# Patient Record
Sex: Male | Born: 1935 | Race: White | Hispanic: No | Marital: Married | Smoking: Never smoker
Health system: Southern US, Community
[De-identification: ages and names within clinical notes are randomized; demographics above are authoritative.]

## PROBLEM LIST (undated history)

## (undated) DIAGNOSIS — E538 Deficiency of other specified B group vitamins: Secondary | ICD-10-CM

## (undated) DIAGNOSIS — I10 Essential (primary) hypertension: Secondary | ICD-10-CM

## (undated) DIAGNOSIS — I2510 Atherosclerotic heart disease of native coronary artery without angina pectoris: Secondary | ICD-10-CM

## (undated) DIAGNOSIS — G709 Myoneural disorder, unspecified: Secondary | ICD-10-CM

## (undated) DIAGNOSIS — E785 Hyperlipidemia, unspecified: Secondary | ICD-10-CM

## (undated) DIAGNOSIS — I639 Cerebral infarction, unspecified: Secondary | ICD-10-CM

## (undated) DIAGNOSIS — K219 Gastro-esophageal reflux disease without esophagitis: Secondary | ICD-10-CM

## (undated) DIAGNOSIS — C61 Malignant neoplasm of prostate: Secondary | ICD-10-CM

## (undated) DIAGNOSIS — J449 Chronic obstructive pulmonary disease, unspecified: Secondary | ICD-10-CM

## (undated) HISTORY — PX: KNEE SURGERY: SHX244

## (undated) HISTORY — DX: Myoneural disorder, unspecified: G70.9

## (undated) HISTORY — DX: Atherosclerotic heart disease of native coronary artery without angina pectoris: I25.10

## (undated) HISTORY — DX: Essential (primary) hypertension: I10

## (undated) HISTORY — DX: Gastro-esophageal reflux disease without esophagitis: K21.9

## (undated) HISTORY — PX: OTHER SURGICAL HISTORY: SHX169

## (undated) HISTORY — DX: Chronic obstructive pulmonary disease, unspecified: J44.9

## (undated) HISTORY — PX: BACK SURGERY: SHX140

## (undated) HISTORY — DX: Hyperlipidemia, unspecified: E78.5

## (undated) HISTORY — PX: HERNIA REPAIR: SHX51

## (undated) HISTORY — DX: Deficiency of other specified B group vitamins: E53.8

## (undated) HISTORY — DX: Cerebral infarction, unspecified: I63.9

---

## 2004-12-13 ENCOUNTER — Ambulatory Visit: Payer: Self-pay | Admitting: Ophthalmology

## 2004-12-19 ENCOUNTER — Ambulatory Visit: Payer: Self-pay | Admitting: Ophthalmology

## 2005-01-20 ENCOUNTER — Ambulatory Visit: Payer: Self-pay | Admitting: Ophthalmology

## 2005-01-30 ENCOUNTER — Ambulatory Visit: Payer: Self-pay | Admitting: Ophthalmology

## 2005-02-07 ENCOUNTER — Ambulatory Visit: Payer: Self-pay | Admitting: Family Medicine

## 2005-03-24 ENCOUNTER — Emergency Department: Payer: Self-pay | Admitting: General Practice

## 2007-05-02 ENCOUNTER — Ambulatory Visit: Payer: Self-pay | Admitting: Gastroenterology

## 2007-06-03 ENCOUNTER — Ambulatory Visit: Payer: Self-pay | Admitting: Urology

## 2007-07-23 ENCOUNTER — Ambulatory Visit: Payer: Self-pay | Admitting: Urology

## 2007-07-23 ENCOUNTER — Other Ambulatory Visit: Payer: Self-pay

## 2007-08-06 ENCOUNTER — Ambulatory Visit: Payer: Self-pay | Admitting: Urology

## 2007-08-08 ENCOUNTER — Emergency Department: Payer: Self-pay | Admitting: Emergency Medicine

## 2007-10-17 DIAGNOSIS — C61 Malignant neoplasm of prostate: Secondary | ICD-10-CM

## 2007-10-17 HISTORY — DX: Malignant neoplasm of prostate: C61

## 2007-10-30 ENCOUNTER — Ambulatory Visit: Payer: Self-pay | Admitting: Gastroenterology

## 2007-12-05 ENCOUNTER — Other Ambulatory Visit: Payer: Self-pay

## 2007-12-05 ENCOUNTER — Emergency Department: Payer: Self-pay | Admitting: Internal Medicine

## 2007-12-06 LAB — HM COLONOSCOPY: HM COLON: NORMAL

## 2007-12-09 ENCOUNTER — Ambulatory Visit: Payer: Self-pay | Admitting: Gastroenterology

## 2008-04-27 ENCOUNTER — Ambulatory Visit: Payer: Self-pay | Admitting: Specialist

## 2008-05-04 ENCOUNTER — Inpatient Hospital Stay: Payer: Self-pay | Admitting: Specialist

## 2008-08-14 ENCOUNTER — Emergency Department: Payer: Self-pay | Admitting: Emergency Medicine

## 2008-09-16 ENCOUNTER — Ambulatory Visit: Payer: Self-pay | Admitting: Vascular Surgery

## 2008-09-23 ENCOUNTER — Inpatient Hospital Stay: Payer: Self-pay | Admitting: Vascular Surgery

## 2008-10-16 HISTORY — PX: COLONOSCOPY: SHX174

## 2008-12-12 IMAGING — RF DG UGI W/O KUB
1 series · 9 of 9 positions shown · non-contrast
Comparison: none

REASON FOR EXAM: dysphagia
COMMENTS:

PROCEDURE:     FL  - FL UPPER GI  - December 09, 2007  [DATE]
RESULT:     Standard Upper GI was performed.  This was a limited study due
to the patient's difficulty with moving on the table. The esophagus is
widely patent. The stomach and duodenal bulb are normal.  No reflux is
noted.

[Series 1: run · 9 of 9 slices shown]
[im 1/9]
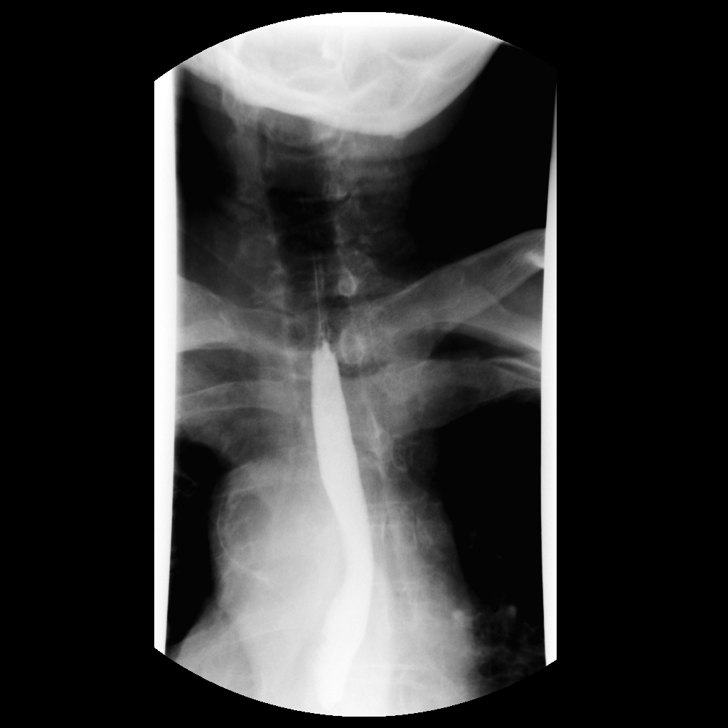
[im 2/9]
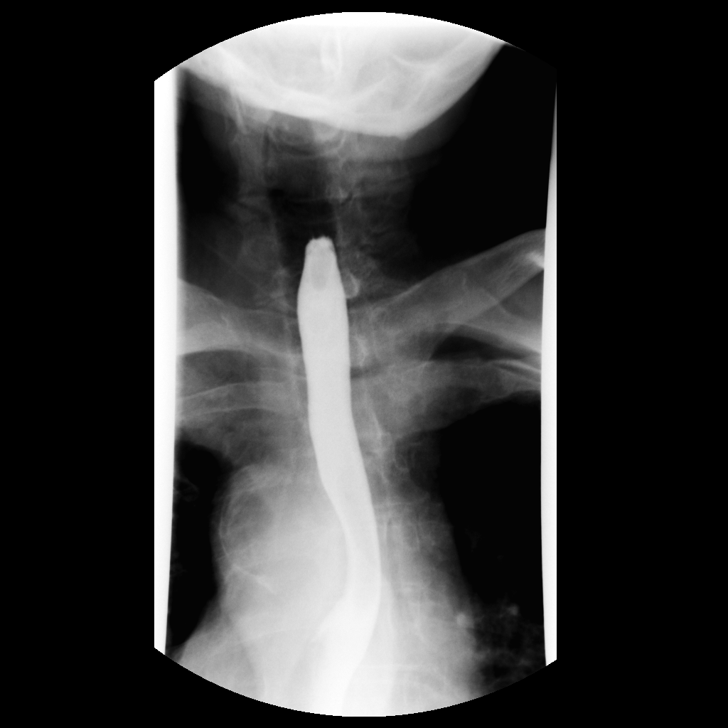
[im 3/9]
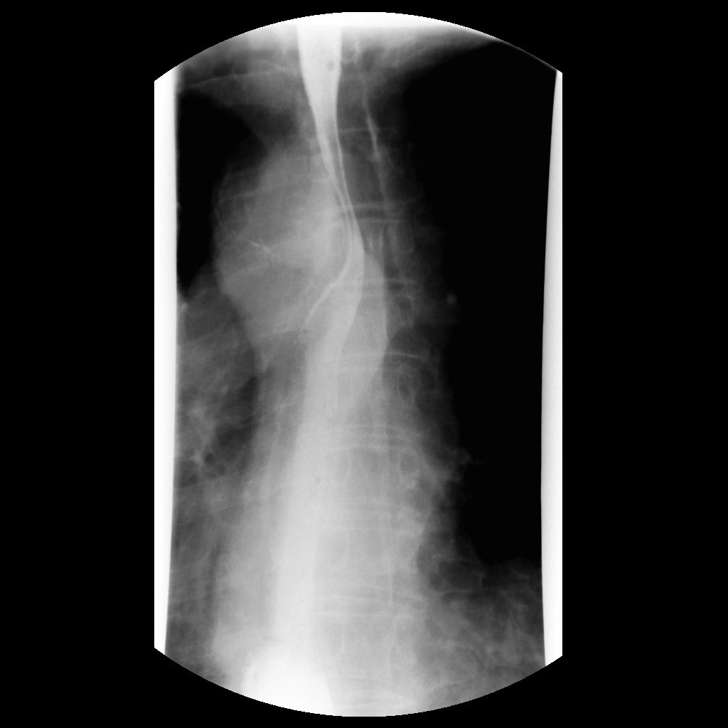
[im 4/9]
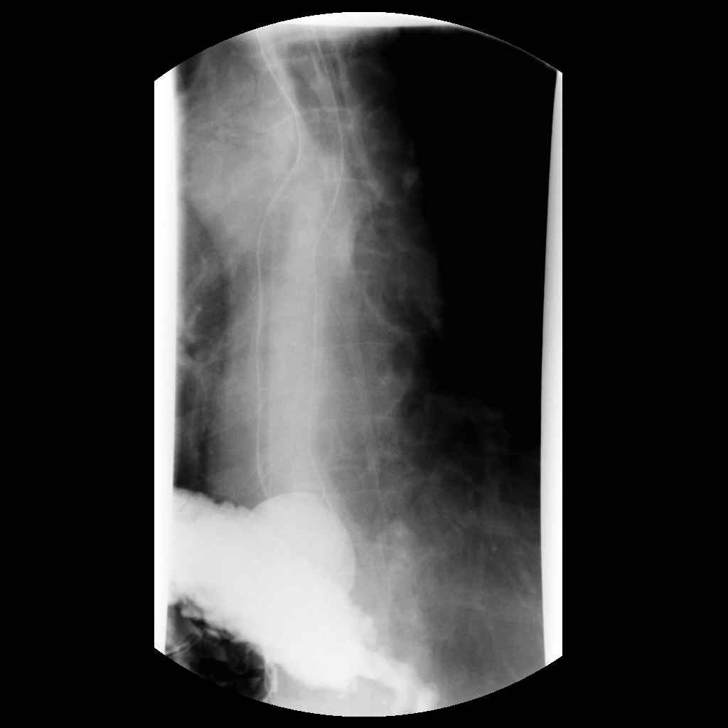
[im 5/9]
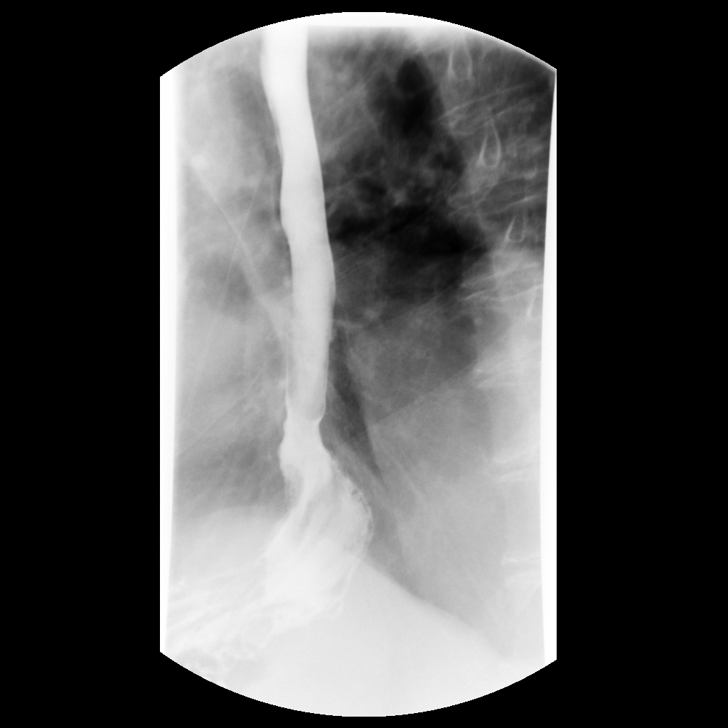
[im 6/9]
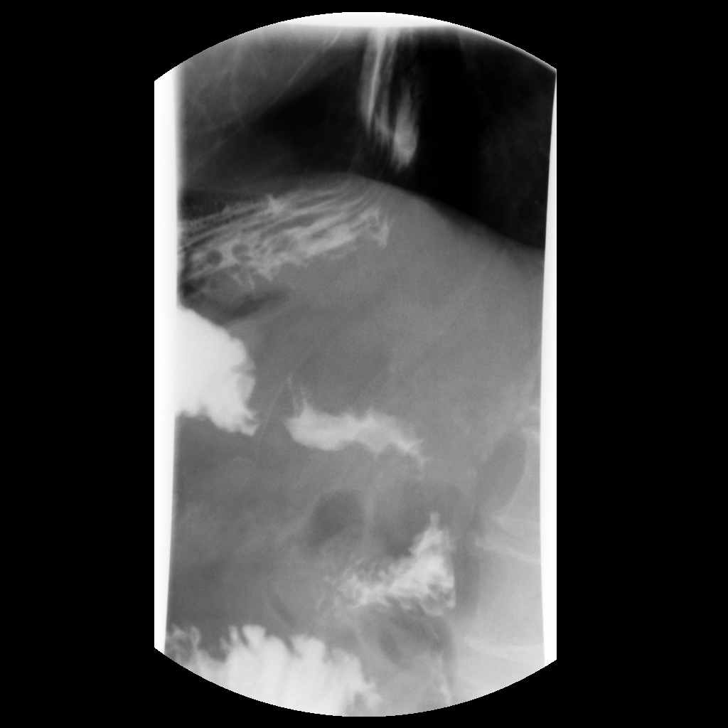
[im 7/9]
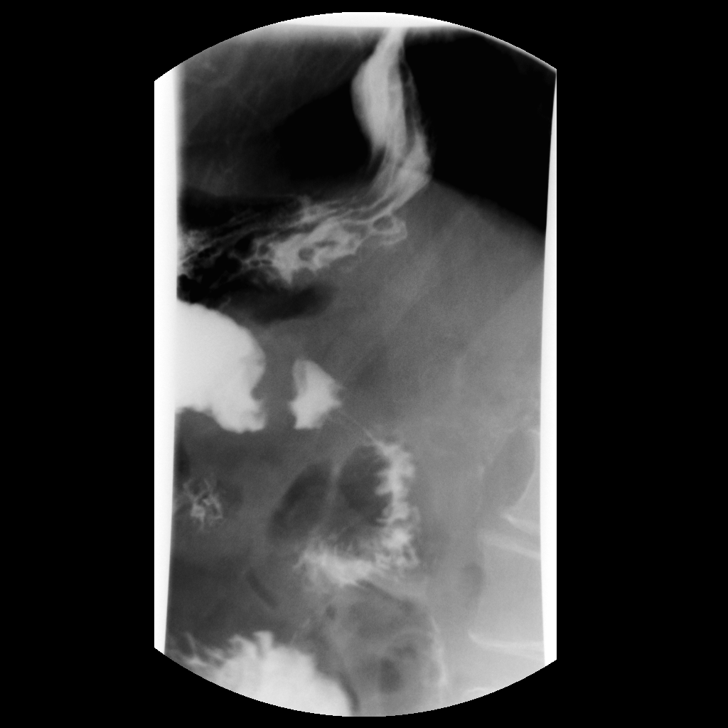
[im 8/9]
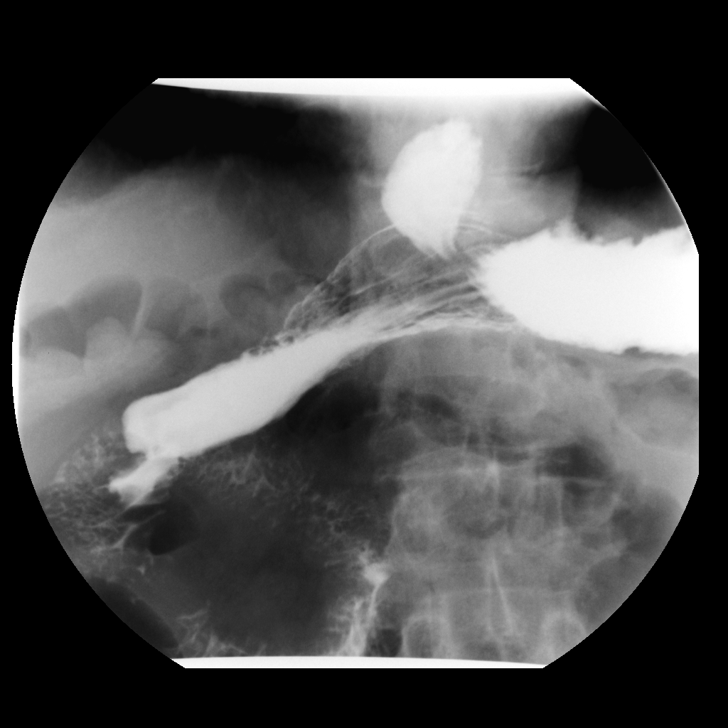
[im 9/9]
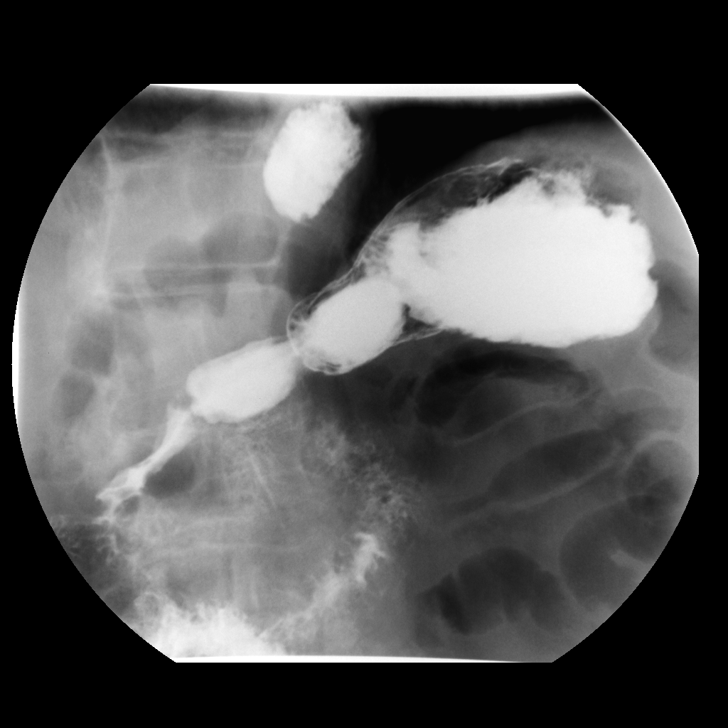

[9 of 9 positions shown; findings below may reference images not displayed]

IMPRESSION: Normal exam.

## 2009-08-11 ENCOUNTER — Ambulatory Visit: Payer: Self-pay | Admitting: Urology

## 2010-05-04 ENCOUNTER — Ambulatory Visit: Payer: Self-pay | Admitting: Vascular Surgery

## 2010-08-17 ENCOUNTER — Emergency Department: Payer: Self-pay | Admitting: Emergency Medicine

## 2012-02-13 ENCOUNTER — Ambulatory Visit: Payer: Self-pay | Admitting: Family Medicine

## 2012-06-25 DIAGNOSIS — C61 Malignant neoplasm of prostate: Secondary | ICD-10-CM | POA: Insufficient documentation

## 2012-06-25 DIAGNOSIS — D419 Neoplasm of uncertain behavior of unspecified urinary organ: Secondary | ICD-10-CM | POA: Insufficient documentation

## 2012-06-25 DIAGNOSIS — M5430 Sciatica, unspecified side: Secondary | ICD-10-CM | POA: Insufficient documentation

## 2012-07-01 DIAGNOSIS — N200 Calculus of kidney: Secondary | ICD-10-CM | POA: Insufficient documentation

## 2012-10-16 ENCOUNTER — Observation Stay: Payer: Self-pay | Admitting: Internal Medicine

## 2012-10-16 LAB — CBC
HCT: 37.6 % — ABNORMAL LOW (ref 40.0–52.0)
HGB: 13.2 g/dL (ref 13.0–18.0)
MCH: 31.7 pg (ref 26.0–34.0)
MCV: 90 fL (ref 80–100)
Platelet: 135 10*3/uL — ABNORMAL LOW (ref 150–440)
RBC: 4.18 10*6/uL — ABNORMAL LOW (ref 4.40–5.90)
RDW: 13.6 % (ref 11.5–14.5)
WBC: 5.7 10*3/uL (ref 3.8–10.6)

## 2012-10-16 LAB — BASIC METABOLIC PANEL
Anion Gap: 9 (ref 7–16)
Calcium, Total: 9.1 mg/dL (ref 8.5–10.1)
Chloride: 106 mmol/L (ref 98–107)
Co2: 24 mmol/L (ref 21–32)
Creatinine: 0.94 mg/dL (ref 0.60–1.30)
EGFR (Non-African Amer.): >60
Glucose: 131 mg/dL — ABNORMAL HIGH (ref 65–99)

## 2012-10-17 LAB — TROPONIN I: Troponin-I: <0.02 ng/mL

## 2012-10-25 ENCOUNTER — Ambulatory Visit: Payer: Self-pay | Admitting: Oncology

## 2012-11-16 ENCOUNTER — Ambulatory Visit: Payer: Self-pay | Admitting: Oncology

## 2013-07-09 DIAGNOSIS — N320 Bladder-neck obstruction: Secondary | ICD-10-CM | POA: Insufficient documentation

## 2013-10-27 ENCOUNTER — Emergency Department: Payer: Self-pay | Admitting: Emergency Medicine

## 2013-10-27 LAB — COMPREHENSIVE METABOLIC PANEL
ANION GAP: 7 (ref 7–16)
Albumin: 3.8 g/dL (ref 3.4–5.0)
Alkaline Phosphatase: 67 U/L
BILIRUBIN TOTAL: 0.4 mg/dL (ref 0.2–1.0)
BUN: 10 mg/dL (ref 7–18)
CHLORIDE: 102 mmol/L (ref 98–107)
CREATININE: 0.86 mg/dL (ref 0.60–1.30)
Calcium, Total: 8.9 mg/dL (ref 8.5–10.1)
Co2: 22 mmol/L (ref 21–32)
EGFR (Non-African Amer.): >60
Glucose: 101 mg/dL — ABNORMAL HIGH (ref 65–99)
Osmolality: 262 (ref 275–301)
Potassium: 3.7 mmol/L (ref 3.5–5.1)
SGOT(AST): 31 U/L (ref 15–37)
SGPT (ALT): 18 U/L (ref 12–78)
Sodium: 131 mmol/L — ABNORMAL LOW (ref 136–145)
TOTAL PROTEIN: 7.1 g/dL (ref 6.4–8.2)

## 2013-10-27 LAB — CBC
HCT: 36.1 % — ABNORMAL LOW (ref 40.0–52.0)
HGB: 12.7 g/dL — ABNORMAL LOW (ref 13.0–18.0)
MCH: 30.7 pg (ref 26.0–34.0)
MCHC: 35.1 g/dL (ref 32.0–36.0)
MCV: 87 fL (ref 80–100)
Platelet: 163 10*3/uL (ref 150–440)
RBC: 4.13 10*6/uL — ABNORMAL LOW (ref 4.40–5.90)
RDW: 13.5 % (ref 11.5–14.5)
WBC: 9 10*3/uL (ref 3.8–10.6)

## 2013-10-27 LAB — CK TOTAL AND CKMB (NOT AT ARMC)
CK, Total: 137 U/L (ref 35–232)
CK-MB: 3.4 ng/mL (ref 0.5–3.6)

## 2013-10-27 LAB — TROPONIN I: Troponin-I: <0.02 ng/mL

## 2013-10-27 LAB — RAPID INFLUENZA A&B ANTIGENS

## 2014-02-17 ENCOUNTER — Ambulatory Visit: Payer: Self-pay | Admitting: Gastroenterology

## 2014-03-21 ENCOUNTER — Inpatient Hospital Stay: Payer: Self-pay | Admitting: Specialist

## 2014-03-21 LAB — COMPREHENSIVE METABOLIC PANEL
ALBUMIN: 3.7 g/dL (ref 3.4–5.0)
ALT: 24 U/L (ref 12–78)
AST: 37 U/L (ref 15–37)
Alkaline Phosphatase: 60 U/L
Anion Gap: 8 (ref 7–16)
BUN: 8 mg/dL (ref 7–18)
Bilirubin,Total: 0.6 mg/dL (ref 0.2–1.0)
Calcium, Total: 8.7 mg/dL (ref 8.5–10.1)
Chloride: 102 mmol/L (ref 98–107)
Co2: 25 mmol/L (ref 21–32)
Creatinine: 0.65 mg/dL (ref 0.60–1.30)
EGFR (African American): >60
EGFR (Non-African Amer.): >60
Glucose: 98 mg/dL (ref 65–99)
Osmolality: 268 (ref 275–301)
Potassium: 4.1 mmol/L (ref 3.5–5.1)
Sodium: 135 mmol/L — ABNORMAL LOW (ref 136–145)
Total Protein: 6.5 g/dL (ref 6.4–8.2)

## 2014-03-21 LAB — CBC
HCT: 35.3 % — ABNORMAL LOW (ref 40.0–52.0)
HGB: 12.1 g/dL — ABNORMAL LOW (ref 13.0–18.0)
MCH: 31.1 pg (ref 26.0–34.0)
MCHC: 34.2 g/dL (ref 32.0–36.0)
MCV: 91 fL (ref 80–100)
Platelet: 124 10*3/uL — ABNORMAL LOW (ref 150–440)
RBC: 3.88 10*6/uL — AB (ref 4.40–5.90)
RDW: 13.8 % (ref 11.5–14.5)
WBC: 3.7 10*3/uL — AB (ref 3.8–10.6)

## 2014-03-21 LAB — PROTIME-INR
INR: 1.1
PROTHROMBIN TIME: 13.9 s (ref 11.5–14.7)

## 2014-03-21 LAB — URINALYSIS, COMPLETE
BILIRUBIN, UR: NEGATIVE
Bacteria: NONE SEEN
Blood: NEGATIVE
GLUCOSE, UR: NEGATIVE mg/dL (ref 0–75)
KETONE: NEGATIVE
Leukocyte Esterase: NEGATIVE
NITRITE: NEGATIVE
PROTEIN: NEGATIVE
Ph: 6 (ref 4.5–8.0)
Specific Gravity: 1.011 (ref 1.003–1.030)
Squamous Epithelial: NONE SEEN

## 2014-03-22 LAB — CBC WITH DIFFERENTIAL/PLATELET
BASOS ABS: 0 10*3/uL (ref 0.0–0.1)
Basophil %: 0.1 %
Eosinophil #: 0 10*3/uL (ref 0.0–0.7)
Eosinophil %: 0 %
HCT: 30.2 % — AB (ref 40.0–52.0)
HGB: 10.3 g/dL — ABNORMAL LOW (ref 13.0–18.0)
LYMPHS ABS: 0.3 10*3/uL — AB (ref 1.0–3.6)
Lymphocyte %: 3.2 %
MCH: 30.8 pg (ref 26.0–34.0)
MCHC: 34.2 g/dL (ref 32.0–36.0)
MCV: 90 fL (ref 80–100)
Monocyte #: 0.6 x10 3/mm (ref 0.2–1.0)
Monocyte %: 5.8 %
Neutrophil #: 9 10*3/uL — ABNORMAL HIGH (ref 1.4–6.5)
Neutrophil %: 90.9 %
Platelet: 120 10*3/uL — ABNORMAL LOW (ref 150–440)
RBC: 3.36 10*6/uL — AB (ref 4.40–5.90)
RDW: 13.9 % (ref 11.5–14.5)
WBC: 9.9 10*3/uL (ref 3.8–10.6)

## 2014-03-22 LAB — BASIC METABOLIC PANEL
ANION GAP: 10 (ref 7–16)
BUN: 12 mg/dL (ref 7–18)
Calcium, Total: 8.7 mg/dL (ref 8.5–10.1)
Chloride: 101 mmol/L (ref 98–107)
Co2: 23 mmol/L (ref 21–32)
Creatinine: 1.07 mg/dL (ref 0.60–1.30)
EGFR (African American): >60
EGFR (Non-African Amer.): >60
Glucose: 134 mg/dL — ABNORMAL HIGH (ref 65–99)
Osmolality: 270 (ref 275–301)
Potassium: 4 mmol/L (ref 3.5–5.1)
Sodium: 134 mmol/L — ABNORMAL LOW (ref 136–145)

## 2014-03-23 LAB — BASIC METABOLIC PANEL
Anion Gap: 6 — ABNORMAL LOW (ref 7–16)
BUN: 13 mg/dL (ref 7–18)
CHLORIDE: 102 mmol/L (ref 98–107)
CO2: 27 mmol/L (ref 21–32)
Calcium, Total: 8.4 mg/dL — ABNORMAL LOW (ref 8.5–10.1)
Creatinine: 0.88 mg/dL (ref 0.60–1.30)
EGFR (African American): >60
EGFR (Non-African Amer.): >60
GLUCOSE: 104 mg/dL — AB (ref 65–99)
Osmolality: 271 (ref 275–301)
Potassium: 3.9 mmol/L (ref 3.5–5.1)
Sodium: 135 mmol/L — ABNORMAL LOW (ref 136–145)

## 2014-03-23 LAB — HEMOGLOBIN: HGB: 9.6 g/dL — ABNORMAL LOW (ref 13.0–18.0)

## 2014-03-25 DIAGNOSIS — E785 Hyperlipidemia, unspecified: Secondary | ICD-10-CM

## 2014-03-25 DIAGNOSIS — S72009D Fracture of unspecified part of neck of unspecified femur, subsequent encounter for closed fracture with routine healing: Secondary | ICD-10-CM

## 2014-03-25 DIAGNOSIS — I10 Essential (primary) hypertension: Secondary | ICD-10-CM

## 2014-03-25 DIAGNOSIS — I2589 Other forms of chronic ischemic heart disease: Secondary | ICD-10-CM

## 2014-03-25 DIAGNOSIS — C61 Malignant neoplasm of prostate: Secondary | ICD-10-CM

## 2014-03-25 DIAGNOSIS — K2100 Gastro-esophageal reflux disease with esophagitis, without bleeding: Secondary | ICD-10-CM

## 2014-03-27 DIAGNOSIS — F3289 Other specified depressive episodes: Secondary | ICD-10-CM

## 2014-03-27 DIAGNOSIS — F411 Generalized anxiety disorder: Secondary | ICD-10-CM

## 2014-03-27 DIAGNOSIS — F329 Major depressive disorder, single episode, unspecified: Secondary | ICD-10-CM

## 2014-03-30 DIAGNOSIS — R059 Cough, unspecified: Secondary | ICD-10-CM

## 2014-03-30 DIAGNOSIS — R05 Cough: Secondary | ICD-10-CM

## 2014-03-30 DIAGNOSIS — K5900 Constipation, unspecified: Secondary | ICD-10-CM

## 2014-03-30 DIAGNOSIS — R0982 Postnasal drip: Secondary | ICD-10-CM

## 2014-05-05 ENCOUNTER — Ambulatory Visit: Payer: Self-pay | Admitting: Gastroenterology

## 2014-10-26 ENCOUNTER — Ambulatory Visit: Payer: Self-pay | Admitting: Family Medicine

## 2014-12-10 LAB — LIPID PANEL
Cholesterol: 167 mg/dL (ref 0–200)
HDL: 42 mg/dL (ref 35–70)
LDL Cholesterol: 99 mg/dL
Triglycerides: 132 mg/dL (ref 40–160)

## 2014-12-10 LAB — PSA: PSA: 2.5

## 2014-12-10 LAB — BASIC METABOLIC PANEL
CREATININE: 0.8 mg/dL (ref <=1.3)
GLUCOSE: 96 mg/dL

## 2015-02-05 NOTE — Discharge Summary (Signed)
DATE OF BIRTH:  1929/08/27  ADMITTING DIAGNOSIS:  Chest pain.   DISCHARGE DIAGNOSES:  1.  Chest pain of unclear etiology at this time, suspected due to work of breathing.  2.  Hypertension.  3.  Hyperlipidemia.  4.  Gastroesophageal reflux disease.  5.  History of stroke, prostate  carcinoma.  6.  Pernicious anemia.  7.  Chronic obstructive pulmonary disease.  8.  Chronic respiratory failure.  9.  Emphysema on CT scan of the chest.  10.  Right upper lobe stellate lesion on CT of chest concerning for possible cancerous condition.   DISCHARGE CONDITION:  Stable.   DISCHARGE MEDICATIONS:  The patient is to continue metoprolol succinate ER 50 mg p.o. daily, aspirin 81 mg p.o. daily, clopidogrel 75 mg p.o. daily, tramadol 50 mg p.o. twice daily, B12 injections, Lupron 1 dose every 4 weeks, alprazolam 0.5 mg p.o. as needed, loratadine 10 mg p.o. daily, pantoprazole 40 mg p.o. daily. The patient is going to taper omeprazole because of concerns of interaction of omeprazole with clopidogrel. This is a new medication.  Home oxygen with portable tank at 2 liters of oxygen through nasal cannula continuously.   DIET:  Two gram salt, low fat, low cholesterol, mechanical soft.   ACTIVITY LIMITATIONS:  As tolerated.     FOLLOWUP:  Appointment with Dr. Otilio Miu in 2 days after discharge, as well as Dr. Oliva Bustard in 2 days after discharge.   CONSULTANTS:  Care management.   RADIOLOGIC STUDIES:  Chest x-ray, portable single view, 10/16/2012, showed hyperinflation, otherwise no acute cardiopulmonary disease. Lower extremity Doppler ultrasound, 10/16/2012, revealed no evidence of DVT in left lower extremity. Myoview stress test on 10/17/2012 revealed normal left ventricular function, normal wall motion, normal sestamibi scintigraphy without evidence of scar or ischemia. CT scan of chest for pulmonary embolism with IV contrast, 10/17/2012, revealed ill-defined 1.3 cm stellate lesion in the upper lobe on the  right on the right, possible lung cancer. There are multiple indeterminate mediastinal, subcarinal, paraesophageal lymph nodes. PET/CT was suggested for further evaluation. Coronary artery disease was also noted.   HISTORY OF PRESENT ILLNESS AND HOSPITAL COURSE:  The patient is an 81 year old Caucasian male with past medical history significant for history of tobacco abuse, who presented to the hospital with complaints of chest pain. Please refer to Dr. Tillman Sers admission note on 10/16/2012.   On arrival to the hospital, the patient's vital signs, temperature was 96.5, pulse was 69, respiratory rate was 20, blood pressure 135/69; saturation was not checked. Physical exam was unremarkable. The patient's EKG showed normal sinus rhythm with low voltage. Ultrasound of the left leg showed no DVT. The patient's lab data revealed normal BMP, except the glucose level was elevated at 131, otherwise unremarkable study. The patient's cardiac enzymes times 3 did not show any abnormalities. The patient's CBC showed a white blood cell count of 5.7, hemoglobin was 13.2, platelet count 135. Chest x-ray was unremarkable.   The patient was admitted to telemetry. His cardiac enzymes were cycled, and he underwent cardiac evaluation with Myoview stress test. Myoview stress test was unremarkable for cardiac ischemia. It showed also normal cardiac function. However, the patient was noted to be hypoxic whenever he was ambulated to be discharged home, and because of concerns of possible emphysema and chronic respiratory failure versus pulmonary embolism, the patient underwent CT scanning of his chest with IV contrast. CT scanning revealed no pulmonary embolism; however, chronic changes of the lungs were described. Changes of COPD were noted. Also  ill-defined 1.3 cm density in the posterior aspect of right upper lobe was noted, concerning for possible lung cancer.   I discussed this patient's case with Dr. Oliva Bustard, who recommended  PET/CT scanning, as well as workup of this nodule in his office in the next few days after discharge. He will be scheduling an appointment with Dr. Oliva Bustard, and the patient will be following with the hematologist, primary care physician, in the next few days after discharge. The patient was also recommended to have oxygen therapy at home. He qualified for oxygen therapy. He is to use 2 liters of oxygen through nasal cannula 24/7. I feel the patient has chronic respiratory failure at this point with underlying COPD, emphysema,  RUL stellate lesion, concerning for lung cancer. He also has  hypertension, hyperlipidemia, gastroesophageal reflux disease, as well as history of stroke, prostate carcinoma. The patient is to continue his outpatient medications. No significant changes were made, except the patient was advised to discontinue his omeprazole, but start on Protonix due to possible interaction with his Plavix. The patient is being discharged home with above-mentioned medications and followup. On the day of discharge patient's blood pressure 152/88, saturation was 93% on 2 liters of oxygen through nasal cannula at rest and 87% at rest on room air.   TIME SPENT:  40 minutes.    ____________________________ Theodoro Grist, MD rv:ms D: 10/17/2012 18:45:00 ET T: 10/17/2012 19:42:34 ET JOB#: [id number]  cc: Delorise Shiner K. Oliva Bustard, MD Juline Patch, MD Theodoro Grist, MD, <Dictator>    [place] MD ELECTRONICALLY SIGNED 11/10/2012 20:59

## 2015-02-05 NOTE — H&P (Signed)
PATIENT NAME:  Alexander Green, Alexander Green MR#:  [id number] DATE OF BIRTH:  [birth year]  DATE OF ADMISSION:  10/16/2012  PRIMARY CARE PHYSICIAN: Juline Patch, MD  CHIEF COMPLAINT: Chest pain.   HISTORY OF PRESENT ILLNESS: This is an 81 year old male who has a history of coronary artery disease. Last night did not sleep well, had chest tightness, worse when he lay down. He had pain in the left side of his chest. It radiated down his left arm. He got short of breath. He also complained of gas and coughing that was worse when he lay down, but today symptoms persisted so he came in to be checked out. Currently he is pain-free, but we will go ahead and admit him for observation for further assessment.   PAST MEDICAL HISTORY:  1.  Coronary artery disease.  2.  Hypertension.  3.  Hyperlipidemia.  4.  History of CVA.  5.  GERD.  6.  Pernicious anemia.  7.  Prostate cancer.   PAST SURGICAL HISTORY: AAA repair in 2009, back surgery, right total knee replacement.   ALLERGIES: No known drug allergies.   CURRENT MEDICATIONS: Alprazolam 0.5 mg q.8 hours p.r.n., aspirin 81 mg daily, Plavix 75 mg daily, loratadine 10 mg daily, metoprolol ER 50 mg daily, omeprazole 40 mg daily, tramadol 50 mg q.6 hours p.r.n., B12 injection monthly, Lupron intramuscular every 6 months.   SOCIAL HISTORY: Stopped smoking 33 years ago. Does not drink alcohol.   FAMILY HISTORY: Significant for cancer, but denies any coronary artery disease in the family.   REVIEW OF SYSTEMS:   CONSTITUTIONAL: No fever or chills.  EYES: No blurred vision.  ENT: No hearing loss.  CARDIOVASCULAR: He has had some chest pain.  PULMONARY: He has had some shortness of breath.  GASTROINTESTINAL: He has had some belching.  GENITOURINARY: No dysuria.  ENDOCRINE: No heat or cold intolerance.  INTEGUMENTARY: No rash.  MUSCULOSKELETAL: Occasional joint pain.  NEUROLOGIC: No numbness or weakness.   PHYSICAL EXAMINATION:  VITAL SIGNS: Temperature is  96.5, pulse 69, respirations 20, blood pressure 135/69.  GENERAL: This is a well-nourished white male in no acute distress.  HEENT: The pupils are equal, round and reactive to light. Sclerae are anicteric. Oral mucosa is moist. Oropharynx is clear. Nasopharynx is clear.  NECK: Supple. No JVD, lymphadenopathy or thyromegaly.  CARDIOVASCULAR: Regular rate and rhythm. There is a I/VI systolic murmur.  LUNGS: Clear to auscultation. No dullness to percussion. He is not using accessory muscles.  ABDOMEN: Soft, nontender, nondistended. Bowel sounds are positive. No hepatosplenomegaly. No masses.  EXTREMITIES: There is edema in the left ankle, none in the other extremity. He moves all extremities and has full range of motion.  NEUROLOGIC: Cranial nerves II through XII are intact. He is alert and oriented x 4.  SKIN: Moist with no rash.   LABORATORY, DIAGNOSTIC AND RADIOLOGICAL DATA: EKG shows normal sinus rhythm with low voltage. Ultrasound of the left leg shows no DVT. White blood cells are 5.7, hemoglobin 13.2. BUN is 9, creatinine 0.94. Troponin is less than 0.02.   ASSESSMENT AND PLAN:  1.  Chest pain, suspicious of coronary artery disease, as he does have a history of this and this pain could be from that. Cannot rule out gastrointestinal at this point. We will go ahead and admit him for observation and cycle cardiac enzymes. If he rules out, I would set him up for Myoview and if this shows no ischemia, then I would pursue gastrointestinal workup as an  outpatient.  2.  Coronary artery disease. Continue his current medications. He is on a beta blocker and aspirin. I did increase his aspirin to full strength because of possible acute event.  3.  Hypertension. He is on a beta blocker for control. Will adjust as necessary.  4.  Gastroesophageal reflux disease. I will continue his proton pump inhibitor.  TIME SPENT ON ADMISSION: 45 minutes.   ____________________________ Baxter Hire,  MD jdj:jm D: 10/16/2012 19:30:06 ET T: 10/16/2012 19:46:10 ET JOB#: [id number]  cc: Baxter Hire, MD, <Dictator> Juline Patch, MD Corey Skains, MD Baxter Hire MD ELECTRONICALLY SIGNED 10/17/2012 20:00

## 2015-02-06 NOTE — Consult Note (Signed)
PATIENT NAME:  Alexander Green, Alexander Green MR#:  [id number] DATE OF BIRTH:  [birth year]  DATE OF CONSULTATION:  03/21/2014  CONSULTING PHYSICIAN:  Laurene Footman, MD  REASON FOR CONSULTATION: Right hip fracture.   HISTORY OF PRESENT ILLNESS: The patient is an 81 year old who fell this morning. He normally uses a walker. He has difficulty with walking secondary to peripheral vascular disease and neuropathy. He had not eaten this morning. He is able to ambulate outside the home with difficulty.   PAST MEDICAL HISTORY: He has significant history of peripheral vascular disease and had a prior aneurysm repair. He has heart disease and is on Plavix.   EXAMINATION:  RIGHT LEG: He is shortened and externally rotated. He does not have a palpable pulse. He has no significant edema. He is able to flex and extend the toes, but diminished sensation to light touch.   RADIOLOGICAL DATA: X-rays reveal an intertrochanteric hip fracture, slightly displaced.   RECOMMENDATION: For open reduction and internal fixation. With his Plavix, it really cannot be reversed, and recommend against waiting. Will plan on surgery today as he has been n.p.o. The risks, benefits and possible complications, in particular bleeding, were discussed. Will use a short rod as this should minimize bleeding intra- and postoperatively.    ___________________________ Laurene Footman, MD mjm:lb D: 03/21/2014 10:18:28 ET T: 03/21/2014 11:26:00 ET JOB#: [id number]  cc: Laurene Footman, MD, <Dictator> Laurene Footman MD ELECTRONICALLY SIGNED 03/21/2014 13:06

## 2015-02-06 NOTE — H&P (Signed)
PATIENT NAME:  Alexander Green, Alexander Green MR#:  [id number] DATE OF BIRTH:  12/22/1928  DATE OF ADMISSION:  03/21/2014  PRIMARY CARE PHYSICIAN: Juline Patch, MD  CHIEF COMPLAINT: A fall and right hip pain.   HISTORY OF PRESENT ILLNESS: This is an 81 year old male who presents to the hospital after a fall he suffered at home. The patient said he was ambulating with the help of his walker, when he saw something on the floor that had fallen down. He attempted to pick it up and then fell backwards and had a difficult time getting up. He was brought to the Emergency Room and noted to have a right hip fracture. The patient denied any prodromal symptoms prior to his fall, like any dizziness, chest pain, shortness of breath, nausea, vomiting or any other associated symptoms. Hospitalist services were contacted for further treatment and evaluation.   REVIEW OF SYSTEMS:  CONSTITUTIONAL: No documented fever. No weight gain, no weight loss.  EYES: No blurry or double vision.  ENT: No tinnitus. No postnasal drip. No redness of the oropharynx.  RESPIRATORY: No cough, no wheeze, no hemoptysis, no dyspnea.  CARDIOVASCULAR: No chest pain, no orthopnea, no palpitations, no syncope.  GASTROINTESTINAL: No nausea, no vomiting. No diarrhea. No abdominal pain. No melena or hematochezia.  GENITOURINARY: No dysuria, no hematuria.  ENDOCRINE: No polyuria or nocturia. No heat or cold intolerance.  HEMATOLOGIC: No anemia, no acute bruising or bleeding.  INTEGUMENT: No rashes, no lesions.  MUSCULOSKELETAL: No arthritis, no swelling, no gout.  NEUROLOGIC: No numbness or tingling. No ataxia. No seizure-type activity.  PSYCHIATRIC: No anxiety. No insomnia. No ADD.   PAST MEDICAL HISTORY: Consistent with:  1. Osteoarthritis.  2. Hypertension.  3. Hyperlipidemia.  4. GERD.  5. History of coronary artery disease.  6. History of prostate cancer.   ALLERGIES: CELEBREX, WHICH CAUSES A RASH.   SOCIAL HISTORY: Used to be a smoker,  quit many years ago. Does have a 30-pack-year smoking history. No alcohol abuse. No illicit drug abuse. Lives at home with his wife.   FAMILY HISTORY: The patient's mother died after a miscarriage. Father died from complications of a cancer of unknown type.   CURRENT MEDICATIONS: As follows:  1. Plavix 75 mg daily.  2. Simvastatin 40 mg daily.  3. Metoprolol succinate 50 mg daily.  4. Protonix 40 mg daily.  5. Nitroglycerin patch 0.1 mg daily.  6. Tramadol 100 mg as needed.  7. Loratadine 10 mg daily.  8. Aspirin 81 mg daily.  9. Vitamin B12 shot every 4 weeks. 10. Lupron shot every 6 months.   PHYSICAL EXAMINATION: Presently, is as follows:  VITAL SIGNS: Noted to be: Temperature is 97.7, pulse 53, respirations 16, blood pressure 139/71, saturation is 95% on room air.  GENERAL: He is a pleasant-appearing male in no apparent distress.  HEAD, EYES, EARS, NOSE AND THROAT: He is atraumatic, normocephalic. Extraocular muscles are intact. Pupils are equal and reactive to light. Sclerae anicteric. No conjunctival injection. No pharyngeal erythema.  NECK: Supple. There is no jugular venous distention. No bruits, no lymphadenopathy, no thyromegaly.  HEART: Regular rate and rhythm, distant, but no murmurs, no rubs, no clicks.  LUNGS: Clear to auscultation bilaterally. No rales or rhonchi. No wheezes.  ABDOMEN: Soft, flat, nontender, nondistended. Has good bowel sounds. No hepatosplenomegaly appreciated.  EXTREMITIES: No evidence of any cyanosis, clubbing or peripheral edema. His right lower extremity is externally rotated and shortened due to the fracture. Has +2 pedal and radial pulses bilaterally.  NEUROLOGICAL: The patient is alert, awake and oriented x3, with no focal motor or sensory deficits appreciated bilaterally.  SKIN: Moist and warm with no rashes appreciated.  LYMPHATIC: There is no cervical or axillary lymphadenopathy.   LABORATORY DATA: Showed a serum glucose of 98, BUN 8, creatinine  0.6, sodium 135, potassium 4.1, chloride 102, bicarbonate 25. The patient's LFTs are within normal limits. White cell count 3.7, hemoglobin 12.1, hematocrit 35.3, platelet count 124. INR is 1.1. Urinalysis within normal limits.   The patient did have an x-ray of the right hip which showed mildly displaced right intertrochanteric fracture. The patient also had a chest x-ray done which showed no evidence of any acute cardiopulmonary disease.   The patient's EKG showed normal sinus rhythm with left access deviation, but no other focal ST or T wave changes appreciated.   ASSESSMENT AND PLAN: This is an 81 year old male with a history of osteoarthritis, hypertension, hyperlipidemia, gastroesophageal reflux disease, history of coronary artery disease, status post stent, prostate cancer, who presents to the hospital after a fall and noted to have a right hip fracture.   1. Right hip fracture. This is secondary to a mechanical fall. The patient is likely a low to moderate risk for noncardiac surgery. There is no contraindication to surgery at this time. EKG reviewed, showed no acute ST changes. I will continue his perioperative beta blocker for now. Hold his aspirin and Plavix for now. Will get an orthopedic consult.  2. Hypertension, hemodynamically stable. Continue with his Toprol.  3. Hyperlipidemia. Continue simvastatin.  4. Gastroesophageal reflux disease. Continue Protonix.  5. History of coronary artery disease. Continue to hold aspirin and Plavix for now given the fact that he may need surgery soon. Continue his beta blocker and statin.  6. History of prostate cancer. This is currently in remission. He follows with Dr. Jacqlyn Larsen. He is on Lupron shots every 6 months.   CODE STATUS: The patient is a full code.   TIME SPENT ON ADMISSION: 45 minutes.    ____________________________ Belia Heman. Verdell Carmine, MD vjs:lb D: 03/21/2014 09:57:21 ET T: 03/21/2014 10:21:44 ET JOB#: [id number]  cc: Belia Heman. Verdell Carmine,  MD, <Dictator> Henreitta Leber MD ELECTRONICALLY SIGNED 03/21/2014 22:00

## 2015-02-06 NOTE — Discharge Summary (Signed)
PATIENT NAME:  Alexander Green, Alexander Green MR#:  [id number] DATE OF BIRTH:  Feb 13, 1929  DATE OF ADMISSION:  03/21/2014 DATE OF DISCHARGE:  03/24/2014  For a detailed note, please take a look at the history and physical done on admission by me.   DIAGNOSES AT DISCHARGE: 1.  Status post fall and right hip fracture.  2.  Hypertension.  3.  Hyperlipidemia.  4.  History of previous coronary artery disease.  5.  Gastroesophageal reflux disease.   DISCHARGE DIET: The patient is being discharged on a low-sodium, low-fat diet.   ACTIVITY: As tolerated.   FOLLOWUP: With Dr. Otilio Miu in the next 1 to 2 weeks. The patient is being discharged to a skilled nursing facility.   DISCHARGE MEDICATIONS:  Loratadine 10 mg daily, simvastatin 40 mg at bedtime, nitroglycerins patch transdermally daily, aspirin 81 mg daily, metoprolol succinate 50 mg daily, Plavix 75 mg daily, Protonix 40 mg daily, vitamin B12 1000 mcg monthly, Lupron shots every 6 months 45 mg intramuscular, Tylenol with hydrocodone 1 tab q.4 to 6 hours as needed for pain.   [place]: Dr. Hessie Knows from orthopedics.   LABORATORY, DIAGNOSTIC AND RADIOLOGICAL DATA DURING The HOSPITAL COURSE:  Are as follows: An x-ray of the right hip showing mildly displaced right intertrochanteric fracture with varus angulation of the femur. A chest x-ray done on admission showing no acute cardiopulmonary disease.   PROCEDURES DONE DURING THE HOSPITAL COURSE: Status post right hip nailing and pinning.   BRIEF HOSPITAL COURSE: This is an 81 year old male with medical problems as mentioned above, presented to the hospital after a fall and noted to have a right hip fracture.  1.  Right hip fracture. This was secondary to a mechanical fall. The patient was cleared from a medical standpoint for surgery and taken to the OR by Dr. Rudene Christians, who performed a pinning of his right hip. Post surgery, the patient has been able to work well with physical therapy.  His pain has been well controlled on some p.r.n. Norco. The patient, after being evaluated by physical therapy, would benefit from short-term rehab which is where he presently is being discharged. He will continue his Plavix and aspirin and follow up with orthopedics as an outpatient.  2.  Hypertension. The patient remained hemodynamically stable. He will continue his Toprol.  3.  Hyperlipidemia. The patient was maintained on his simvastatin. He will resume that.  4.  Gastroesophageal reflux disease. The patient was maintained on his Protonix. He will resume that.  5.  History of coronary artery disease. The patient had no acute chest pain. He will continue his aspirin, Plavix, beta blocker and statin as stated.  6.  History of prostate cancer. This is currently remission. The patient takes Lupron shots every 6 months. He follows up with Dr. Jacqlyn Larsen.  7.  Anemia. This is acute blood loss anemia from surgery. He did not require any transfusions. His hemoglobin remained stable.  8.  CODE STATUS:  The patient is a full code.   DISPOSITION: He is being discharged to a skilled nursing facility for ongoing care.   TIME SPENT: 35 minutes.   ____________________________ Belia Heman. Verdell Carmine, MD vjs:cs D: 03/24/2014 15:15:39 ET T: 03/24/2014 15:33:27 ET JOB#: [id number]  cc: Belia Heman. Verdell Carmine, MD, <Dictator> Juline Patch, MD Henreitta Leber MD ELECTRONICALLY SIGNED 04/14/2014 20:27

## 2015-02-06 NOTE — Consult Note (Signed)
Brief Consult Note: Diagnosis: right intertrochanteric hip fracture.   Patient was seen by consultant.   Consult note dictated.   Recommend to proceed with surgery or procedure.   Orders entered.   Comments: plan [employer] rod later today risks benefits and potential cvmplications discussed.  Electronic Signatures: Laurene Footman (MD)  (Signed 06-Jun-15 10:12)  Authored: Brief Consult Note   Last Updated: 06-Jun-15 10:12 by Laurene Footman (MD)

## 2015-02-06 NOTE — Op Note (Signed)
PATIENT NAME:  Alexander Green, Alexander Green MR#:  [id number] DATE OF BIRTH:  [birth year]  DATE OF PROCEDURE:  03/21/2014  PREOPERATIVE DIAGNOSIS: Right intertrochanteric hip fracture.   POSTOPERATIVE DIAGNOSIS:  Right intertrochanteric hip fracture.  PROCEDURE: ORIF right intertrochanteric hip fracture with cephalomedullary device.   ANESTHESIA: General.   SURGEON: Laurene Footman, MD  DESCRIPTION OF PROCEDURE: The patient was brought to the Operating Room and after adequate anesthesia was obtained, the patient was placed on the fracture table, the left leg in the well leg holder, right foot in the traction boot without traction applied, just slight internal rotation. AP and lateral C-arm views were obtained and showed anatomic alignment. The hip was prepped and draped in the usual sterile fashion and appropriate patient identification and timeout procedures were carried out.  A proximal skin incision was made just proximal to the greater trochanter. Soft tissue spread and the guidewire inserted into the tip of the trochanter. Proximal reaming carried out. An 11-mm short 130-degree nail was then inserted, impacted to the appropriate level and then a small lateral incision was made and a guidewire inserted through the 130-degree guide, measured, drilled, and a 110-mm lag screw was inserted to the subchondral bone. Next, the proximal locking mechanism was tightened and then quarter turned to loosen to allow for compression. Traction was released, and compression was applied through the lateral device. The drill sleeve was removed for the large screw along with the guidewire. A small lateral incision was made for the distal locking screw. Subcutaneous tissue spread, the drill sleeve placed, drilled, measured, and a 36-mm lag screw was inserted through the distal drill hole 5.0 mm in diameter, until it was placed to the appropriate depth. Permanent C-arm views were obtained. After removal of the insertion device, the  hip was thoroughly irrigated and closed with #1 Vicryl proximally, 2-0 Vicryl subcutaneously and skin staples. Xeroform, 4 x 4's, ABDs and tape applied.   ESTIMATED BLOOD LOSS: 250 mL.   COMPLICATIONS: None.   SPECIMEN: None.   IMPLANT: Affixus 130-degree hip fracture nail, 11 x 180 mm, with lag screw and distal interlock.   CONDITION TO RECOVERY ROOM: Stable.      ____________________________ Laurene Footman, MD mjm:lm D: 03/21/2014 16:03:34 ET T: 03/22/2014 01:32:27 ET JOB#: [id number]  cc: Laurene Footman, MD, <Dictator> Laurene Footman MD ELECTRONICALLY SIGNED 03/22/2014 7:30

## 2015-02-10 DIAGNOSIS — I679 Cerebrovascular disease, unspecified: Secondary | ICD-10-CM | POA: Insufficient documentation

## 2015-02-10 DIAGNOSIS — K219 Gastro-esophageal reflux disease without esophagitis: Secondary | ICD-10-CM | POA: Insufficient documentation

## 2015-02-10 DIAGNOSIS — I10 Essential (primary) hypertension: Secondary | ICD-10-CM | POA: Insufficient documentation

## 2015-02-10 DIAGNOSIS — E538 Deficiency of other specified B group vitamins: Secondary | ICD-10-CM | POA: Insufficient documentation

## 2015-02-10 DIAGNOSIS — E7849 Other hyperlipidemia: Secondary | ICD-10-CM | POA: Insufficient documentation

## 2015-02-10 DIAGNOSIS — I2510 Atherosclerotic heart disease of native coronary artery without angina pectoris: Secondary | ICD-10-CM | POA: Insufficient documentation

## 2015-04-13 ENCOUNTER — Other Ambulatory Visit: Payer: Self-pay | Admitting: Family Medicine

## 2015-04-13 DIAGNOSIS — E785 Hyperlipidemia, unspecified: Secondary | ICD-10-CM

## 2015-04-13 DIAGNOSIS — I10 Essential (primary) hypertension: Secondary | ICD-10-CM

## 2015-04-13 DIAGNOSIS — I639 Cerebral infarction, unspecified: Secondary | ICD-10-CM

## 2015-04-13 DIAGNOSIS — K219 Gastro-esophageal reflux disease without esophagitis: Secondary | ICD-10-CM

## 2015-04-16 ENCOUNTER — Ambulatory Visit (INDEPENDENT_AMBULATORY_CARE_PROVIDER_SITE_OTHER): Payer: Medicare Other

## 2015-04-16 DIAGNOSIS — D519 Vitamin B12 deficiency anemia, unspecified: Secondary | ICD-10-CM | POA: Diagnosis not present

## 2015-04-16 MED ORDER — CYANOCOBALAMIN 1000 MCG/ML IJ SOLN: 1000.0000 ug | Freq: Once | INTRAMUSCULAR | Status: DC

## 2015-05-17 ENCOUNTER — Ambulatory Visit (INDEPENDENT_AMBULATORY_CARE_PROVIDER_SITE_OTHER): Payer: Medicare Other

## 2015-05-17 DIAGNOSIS — D519 Vitamin B12 deficiency anemia, unspecified: Secondary | ICD-10-CM

## 2015-05-17 MED ORDER — CYANOCOBALAMIN 1000 MCG/ML IJ SOLN
1000.0000 ug | Freq: Once | INTRAMUSCULAR | Status: AC
  Administered 2015-05-17: 1000 ug via INTRAMUSCULAR

## 2015-06-07 ENCOUNTER — Ambulatory Visit (INDEPENDENT_AMBULATORY_CARE_PROVIDER_SITE_OTHER): Payer: Medicare Other | Admitting: Family Medicine

## 2015-06-07 ENCOUNTER — Encounter: Payer: Self-pay | Admitting: Family Medicine

## 2015-06-07 VITALS — BP 108/76 | HR 68 | Ht 71.0 in | Wt 161.4 lb

## 2015-06-07 DIAGNOSIS — I10 Essential (primary) hypertension: Secondary | ICD-10-CM

## 2015-06-07 DIAGNOSIS — E784 Other hyperlipidemia: Secondary | ICD-10-CM

## 2015-06-07 DIAGNOSIS — Z8639 Personal history of other endocrine, nutritional and metabolic disease: Secondary | ICD-10-CM | POA: Insufficient documentation

## 2015-06-07 DIAGNOSIS — E785 Hyperlipidemia, unspecified: Secondary | ICD-10-CM

## 2015-06-07 DIAGNOSIS — Z8679 Personal history of other diseases of the circulatory system: Secondary | ICD-10-CM | POA: Insufficient documentation

## 2015-06-07 DIAGNOSIS — K219 Gastro-esophageal reflux disease without esophagitis: Secondary | ICD-10-CM | POA: Diagnosis not present

## 2015-06-07 DIAGNOSIS — I471 Supraventricular tachycardia: Secondary | ICD-10-CM | POA: Insufficient documentation

## 2015-06-07 DIAGNOSIS — E7849 Other hyperlipidemia: Secondary | ICD-10-CM

## 2015-06-07 DIAGNOSIS — Z8546 Personal history of malignant neoplasm of prostate: Secondary | ICD-10-CM | POA: Insufficient documentation

## 2015-06-07 DIAGNOSIS — I2510 Atherosclerotic heart disease of native coronary artery without angina pectoris: Secondary | ICD-10-CM | POA: Diagnosis not present

## 2015-06-07 DIAGNOSIS — I639 Cerebral infarction, unspecified: Secondary | ICD-10-CM | POA: Diagnosis not present

## 2015-06-07 MED ORDER — CLOPIDOGREL BISULFATE 75 MG PO TABS: ORAL_TABLET | ORAL | Status: DC

## 2015-06-07 MED ORDER — METOPROLOL SUCCINATE ER 50 MG PO TB24: ORAL_TABLET | ORAL | Status: DC

## 2015-06-07 MED ORDER — PANTOPRAZOLE SODIUM 40 MG PO TBEC: DELAYED_RELEASE_TABLET | ORAL | Status: DC

## 2015-06-07 MED ORDER — NITROGLYCERIN 0.1 MG/HR TD PT24: MEDICATED_PATCH | TRANSDERMAL | Status: DC

## 2015-06-07 MED ORDER — SIMVASTATIN 40 MG PO TABS
ORAL_TABLET | ORAL | Status: DC
Start: 2015-06-07 — End: 2015-12-02

## 2015-06-07 NOTE — Progress Notes (Signed)
Name: Alexander Green   MRN: [id number]    DOB: Oct 02, 1929   Date:06/07/2015       Progress Note  Subjective  Chief Complaint  Chief Complaint  Patient presents with  . Gastrophageal Reflux  . Hypertension  . Hyperlipidemia  . Coronary Artery Disease    Gastrophageal Reflux He reports no abdominal pain, no belching, no chest pain, no choking, no coughing, no dysphagia, no early satiety, no globus sensation, no heartburn, no hoarse voice, no nausea, no sore throat, no stridor, no tooth decay, no water brash or no wheezing. This is a recurrent problem. The current episode started more than 1 year ago. The problem occurs occasionally. The problem has been gradually improving. Nothing aggravates the symptoms. Pertinent negatives include no anemia, fatigue, melena, muscle weakness, orthopnea or weight loss. There are no known risk factors. He has tried a PPI for the symptoms. The treatment provided mild relief.  Hypertension This is a recurrent problem. The current episode started more than 1 year ago. The problem has been gradually improving since onset. The problem is controlled. Pertinent negatives include no anxiety, blurred vision, chest pain, headaches, malaise/fatigue, neck pain, orthopnea, palpitations, peripheral edema, PND, shortness of breath or sweats. There are no associated agents to hypertension. Risk factors for coronary artery disease include dyslipidemia, male gender and post-menopausal state. Past treatments include beta blockers. The current treatment provides mild improvement. There are no compliance problems.  There is no history of angina, kidney disease, CAD/MI, CVA, heart failure, left ventricular hypertrophy, PVD, renovascular disease or retinopathy. There is no history of chronic renal disease.  Hyperlipidemia This is a recurrent problem. The current episode started more than 1 year ago. The problem is controlled. Recent lipid tests were reviewed and are low. He has no  history of chronic renal disease, diabetes, hypothyroidism, liver disease, obesity or nephrotic syndrome. Factors aggravating his hyperlipidemia include beta blockers. Pertinent negatives include no chest pain, focal weakness, myalgias or shortness of breath. Current antihyperlipidemic treatment includes statins. The current treatment provides mild improvement of lipids. There are no compliance problems.  Risk factors for coronary artery disease include hypertension, male sex, post-menopausal, a sedentary lifestyle and dyslipidemia.  Coronary Artery Disease Presents for follow-up visit. The disease course has been improving. Pertinent negatives include no chest pain, chest pressure, chest tightness, dizziness, leg swelling, muscle weakness, palpitations, shortness of breath or weight gain. Risk factors include hyperlipidemia and hypertension. Risk factors do not include obesity. Past treatments include antiplatelet agents, beta blockers, aspirin and nitrates. There is no history of angina pectoris, arrhythmia, cardiomyopathy, CHF, past myocardial infarction, pericarditis or valvular heart disease. Past surgical history does not include angioplasty. The symptoms have been stable. Compliance with diet is good. Compliance with exercise is variable. Compliance with medications is good.    No problem-specific assessment & plan notes found for this encounter.   Past Medical History  Diagnosis Date  . GERD (gastroesophageal reflux disease)   . Hypertension   . Hyperlipidemia   . B12 deficiency   . Stroke   . Neuromuscular disorder   . CAD (coronary artery disease)   . COPD (chronic obstructive pulmonary disease)     Past Surgical History  Procedure Laterality Date  . Back surgery    . Hernia repair    . Hip replacemnt (r)    . Knee surgery Right   . Colonoscopy  2010    History reviewed. No pertinent family history.  Social History   Social History  .  Marital Status: Married    Spouse  Name: N/A  . Number of Children: N/A  . Years of Education: N/A   Occupational History  . Not on file.   Social History Main Topics  . Smoking status: Never Smoker   . Smokeless tobacco: Not on file  . Alcohol Use: No  . Drug Use: No  . Sexual Activity: No   Other Topics Concern  . Not on file   Social History Narrative    Allergies  Allergen Reactions  . Celecoxib      Review of Systems  Constitutional: Negative for fever, chills, weight loss, weight gain, malaise/fatigue and fatigue.  HENT: Negative for ear discharge, ear pain, hoarse voice and sore throat.   Eyes: Negative for blurred vision.  Respiratory: Negative for cough, sputum production, choking, chest tightness, shortness of breath and wheezing.   Cardiovascular: Negative for chest pain, palpitations, orthopnea, leg swelling and PND.  Gastrointestinal: Negative for heartburn, dysphagia, nausea, abdominal pain, diarrhea, constipation, blood in stool and melena.  Genitourinary: Negative for dysuria, urgency, frequency and hematuria.  Musculoskeletal: Negative for myalgias, back pain, joint pain, muscle weakness and neck pain.  Skin: Negative for rash.  Neurological: Negative for dizziness, tingling, sensory change, focal weakness and headaches.  Endo/Heme/Allergies: Negative for environmental allergies and polydipsia. Does not bruise/bleed easily.  Psychiatric/Behavioral: Negative for depression and suicidal ideas. The patient is not nervous/anxious and does not have insomnia.      Objective  Filed Vitals:   06/07/15 0836  BP: 108/76  Pulse: 68  Height: 5\' 11"  (1.803 m)  Weight: 161 lb 6.4 oz (73.211 kg)    Physical Exam  Constitutional: He is oriented to person, place, and time and well-developed, well-nourished, and in no distress.  HENT:  Head: Normocephalic.  Right Ear: External ear normal.  Left Ear: External ear normal.  Nose: Nose normal.  Mouth/Throat: Oropharynx is clear and moist.  Eyes:  Conjunctivae and EOM are normal. Pupils are equal, round, and reactive to light. Right eye exhibits no discharge. Left eye exhibits no discharge. No scleral icterus.  Neck: Normal range of motion. Neck supple. No JVD present. No tracheal deviation present. No thyromegaly present.  Cardiovascular: Normal rate, regular rhythm, normal heart sounds and intact distal pulses.  Exam reveals no gallop and no friction rub.   No murmur heard. Pulmonary/Chest: Breath sounds normal. No respiratory distress. He has no wheezes. He has no rales.  Abdominal: Soft. Bowel sounds are normal. He exhibits no mass. There is no hepatosplenomegaly. There is no tenderness. There is no rebound, no guarding and no CVA tenderness.  Musculoskeletal: Normal range of motion. He exhibits no edema or tenderness.  Lymphadenopathy:    He has no cervical adenopathy.  Neurological: He is alert and oriented to person, place, and time. He has normal sensation, normal strength, normal reflexes and intact cranial nerves. No cranial nerve deficit.  Skin: Skin is warm. No rash noted.  Psychiatric: Mood and affect normal.  Nursing note and vitals reviewed.     Assessment & Plan  Problem List Items Addressed This Visit      Cardiovascular and Mediastinum   Atherosclerosis of coronary artery   Relevant Medications   clopidogrel (PLAVIX) 75 MG tablet   nitroGLYCERIN (NITRODUR - DOSED IN MG/24 HR) 0.1 mg/hr patch   metoprolol succinate (TOPROL-XL) 50 MG 24 hr tablet   simvastatin (ZOCOR) 40 MG tablet   Other Relevant Orders   CBC   Essential (primary) hypertension - Primary  Relevant Medications   nitroGLYCERIN (NITRODUR - DOSED IN MG/24 HR) 0.1 mg/hr patch   metoprolol succinate (TOPROL-XL) 50 MG 24 hr tablet   simvastatin (ZOCOR) 40 MG tablet   Other Relevant Orders   Renal Function Panel     Digestive   Gastro-esophageal reflux disease without esophagitis   Relevant Medications   pantoprazole (PROTONIX) 40 MG tablet      Other   Familial multiple lipoprotein-type hyperlipidemia   Relevant Medications   nitroGLYCERIN (NITRODUR - DOSED IN MG/24 HR) 0.1 mg/hr patch   metoprolol succinate (TOPROL-XL) 50 MG 24 hr tablet   simvastatin (ZOCOR) 40 MG tablet   Other Relevant Orders   Lipid Profile   Hepatic function panel    Other Visit Diagnoses    Cerebrovascular accident (stroke)        Relevant Medications    clopidogrel (PLAVIX) 75 MG tablet    nitroGLYCERIN (NITRODUR - DOSED IN MG/24 HR) 0.1 mg/hr patch    metoprolol succinate (TOPROL-XL) 50 MG 24 hr tablet    simvastatin (ZOCOR) 40 MG tablet    Essential hypertension        Relevant Medications    nitroGLYCERIN (NITRODUR - DOSED IN MG/24 HR) 0.1 mg/hr patch    metoprolol succinate (TOPROL-XL) 50 MG 24 hr tablet    simvastatin (ZOCOR) 40 MG tablet    GERD without esophagitis        Relevant Medications    pantoprazole (PROTONIX) 40 MG tablet    Hyperlipidemia        Relevant Medications    nitroGLYCERIN (NITRODUR - DOSED IN MG/24 HR) 0.1 mg/hr patch    metoprolol succinate (TOPROL-XL) 50 MG 24 hr tablet    simvastatin (ZOCOR) 40 MG tablet         Dr. Randal Yepiz [facility]  06/07/2015

## 2015-06-08 LAB — RENAL FUNCTION PANEL
Albumin: 4.9 g/dL — ABNORMAL HIGH (ref 3.5–4.7)
BUN / CREAT RATIO: 13 (ref 10–22)
BUN: 11 mg/dL (ref 8–27)
CALCIUM: 9.8 mg/dL (ref 8.6–10.2)
CO2: 23 mmol/L (ref 18–29)
CREATININE: 0.84 mg/dL (ref 0.76–1.27)
Chloride: 99 mmol/L (ref 97–108)
GFR calc Af Amer: 93 mL/min/{1.73_m2} (ref >59)
GFR calc non Af Amer: 80 mL/min/{1.73_m2} (ref >59)
GLUCOSE: 88 mg/dL (ref 65–99)
POTASSIUM: 4.2 mmol/L (ref 3.5–5.2)
Phosphorus: 3.5 mg/dL (ref 2.5–4.5)
SODIUM: 138 mmol/L (ref 134–144)

## 2015-06-08 LAB — HEPATIC FUNCTION PANEL
ALT: 10 IU/L (ref 0–44)
AST: 20 IU/L (ref 0–40)
Alkaline Phosphatase: 65 IU/L (ref 39–117)
BILIRUBIN TOTAL: 0.5 mg/dL (ref 0.0–1.2)
BILIRUBIN, DIRECT: 0.21 mg/dL (ref 0.00–0.40)
Total Protein: 6.9 g/dL (ref 6.0–8.5)

## 2015-06-08 LAB — CBC
HEMATOCRIT: 38.1 % (ref 37.5–51.0)
Hemoglobin: 12.4 g/dL — ABNORMAL LOW (ref 12.6–17.7)
MCH: 28.2 pg (ref 26.6–33.0)
MCHC: 32.5 g/dL (ref 31.5–35.7)
MCV: 87 fL (ref 79–97)
Platelets: 145 10*3/uL — ABNORMAL LOW (ref 150–379)
RBC: 4.39 x10E6/uL (ref 4.14–5.80)
RDW: 14.5 % (ref 12.3–15.4)
WBC: 5.7 10*3/uL (ref 3.4–10.8)

## 2015-06-08 LAB — LIPID PANEL
CHOLESTEROL TOTAL: 158 mg/dL (ref 100–199)
Chol/HDL Ratio: 4.3 ratio units (ref 0.0–5.0)
HDL: 37 mg/dL — ABNORMAL LOW (ref >39)
LDL CALC: 95 mg/dL (ref 0–99)
Triglycerides: 130 mg/dL (ref 0–149)
VLDL Cholesterol Cal: 26 mg/dL (ref 5–40)

## 2015-06-17 ENCOUNTER — Ambulatory Visit (INDEPENDENT_AMBULATORY_CARE_PROVIDER_SITE_OTHER): Payer: Medicare Other

## 2015-06-17 DIAGNOSIS — D519 Vitamin B12 deficiency anemia, unspecified: Secondary | ICD-10-CM

## 2015-06-17 MED ORDER — CYANOCOBALAMIN 1000 MCG/ML IJ SOLN
1000.0000 ug | Freq: Once | INTRAMUSCULAR | Status: AC
  Administered 2015-06-17: 1000 ug via INTRAMUSCULAR

## 2015-07-19 ENCOUNTER — Ambulatory Visit (INDEPENDENT_AMBULATORY_CARE_PROVIDER_SITE_OTHER): Payer: Medicare Other

## 2015-07-19 DIAGNOSIS — D519 Vitamin B12 deficiency anemia, unspecified: Secondary | ICD-10-CM | POA: Diagnosis not present

## 2015-07-19 MED ORDER — CYANOCOBALAMIN 1000 MCG/ML IJ SOLN
1000.0000 ug | Freq: Once | INTRAMUSCULAR | Status: AC
  Administered 2015-07-19: 1000 ug via INTRAMUSCULAR

## 2015-08-17 ENCOUNTER — Ambulatory Visit (INDEPENDENT_AMBULATORY_CARE_PROVIDER_SITE_OTHER): Payer: Medicare Other

## 2015-08-17 DIAGNOSIS — D519 Vitamin B12 deficiency anemia, unspecified: Secondary | ICD-10-CM

## 2015-08-17 MED ORDER — CYANOCOBALAMIN 1000 MCG/ML IJ SOLN
1000.0000 ug | Freq: Once | INTRAMUSCULAR | Status: AC
  Administered 2015-08-17: 1000 ug via INTRAMUSCULAR

## 2015-08-18 ENCOUNTER — Other Ambulatory Visit: Payer: Self-pay | Admitting: Urology

## 2015-08-18 DIAGNOSIS — C61 Malignant neoplasm of prostate: Secondary | ICD-10-CM

## 2015-09-06 ENCOUNTER — Encounter
Admission: RE | Admit: 2015-09-06 | Discharge: 2015-09-06 | Disposition: A | Discharge status: Home / Self Care | Payer: Medicare Other | Source: Ambulatory Visit | Attending: Urology | Admitting: Urology

## 2015-09-06 ENCOUNTER — Ambulatory Visit
Admission: RE | Admit: 2015-09-06 | Discharge: 2015-09-06 | Disposition: A | Discharge status: Home / Self Care | Payer: Medicare Other | Source: Ambulatory Visit | Attending: Urology | Admitting: Urology

## 2015-09-06 DIAGNOSIS — C61 Malignant neoplasm of prostate: Secondary | ICD-10-CM | POA: Insufficient documentation

## 2015-09-06 DIAGNOSIS — Z9889 Other specified postprocedural states: Secondary | ICD-10-CM | POA: Insufficient documentation

## 2015-09-06 DIAGNOSIS — K4090 Unilateral inguinal hernia, without obstruction or gangrene, not specified as recurrent: Secondary | ICD-10-CM | POA: Insufficient documentation

## 2015-09-06 DIAGNOSIS — N200 Calculus of kidney: Secondary | ICD-10-CM | POA: Insufficient documentation

## 2015-09-06 DIAGNOSIS — I2510 Atherosclerotic heart disease of native coronary artery without angina pectoris: Secondary | ICD-10-CM | POA: Insufficient documentation

## 2015-09-06 DIAGNOSIS — K8020 Calculus of gallbladder without cholecystitis without obstruction: Secondary | ICD-10-CM | POA: Diagnosis not present

## 2015-09-06 LAB — POCT I-STAT CREATININE: Creatinine, Ser: 0.9 mg/dL (ref 0.61–1.24)

## 2015-09-06 MED ORDER — TECHNETIUM TC 99M MEDRONATE IV KIT
25.0000 | PACK | Freq: Once | INTRAVENOUS | Status: AC | PRN
  Administered 2015-09-06: 23.57 via INTRAVENOUS

## 2015-09-06 MED ORDER — IOHEXOL 350 MG/ML SOLN
80.0000 mL | Freq: Once | INTRAVENOUS | Status: AC | PRN
  Administered 2015-09-06: 80 mL via INTRAVENOUS

## 2015-09-14 ENCOUNTER — Encounter: Payer: Self-pay | Admitting: Family Medicine

## 2015-09-14 ENCOUNTER — Ambulatory Visit (INDEPENDENT_AMBULATORY_CARE_PROVIDER_SITE_OTHER): Payer: Medicare Other | Admitting: Family Medicine

## 2015-09-14 VITALS — BP 130/68 | HR 72 | Temp 98.1°F | Ht 71.0 in | Wt 163.5 lb

## 2015-09-14 DIAGNOSIS — I639 Cerebral infarction, unspecified: Secondary | ICD-10-CM

## 2015-09-14 DIAGNOSIS — J40 Bronchitis, not specified as acute or chronic: Secondary | ICD-10-CM | POA: Diagnosis not present

## 2015-09-14 DIAGNOSIS — J309 Allergic rhinitis, unspecified: Secondary | ICD-10-CM | POA: Diagnosis not present

## 2015-09-14 DIAGNOSIS — E538 Deficiency of other specified B group vitamins: Secondary | ICD-10-CM | POA: Diagnosis not present

## 2015-09-14 MED ORDER — CYANOCOBALAMIN 1000 MCG/ML IJ SOLN
1000.0000 ug | Freq: Once | INTRAMUSCULAR | Status: AC
  Administered 2015-09-14: 1000 ug via INTRAMUSCULAR

## 2015-09-14 MED ORDER — AZELASTINE HCL 0.1 % NA SOLN: 1.0000 | Freq: Every day | NASAL | Status: DC

## 2015-09-14 MED ORDER — CYANOCOBALAMIN 1000 MCG/ML IJ SOLN: 1000.0000 ug | Freq: Once | INTRAMUSCULAR | Status: DC

## 2015-09-14 MED ORDER — LEVOFLOXACIN 500 MG PO TABS: 500.0000 mg | ORAL_TABLET | Freq: Every day | ORAL | Status: DC

## 2015-09-14 NOTE — Progress Notes (Signed)
Name: Alexander Green   MRN: [id number]    DOB: 1929-09-05   Date:09/14/2015       Progress Note  Subjective  Chief Complaint  Chief Complaint  Patient presents with  . Cough    Cough This is a new problem. The current episode started 1 to 4 weeks ago. The problem has been gradually worsening. The problem occurs every few minutes. The cough is productive of sputum. Associated symptoms include nasal congestion and postnasal drip. Pertinent negatives include no chest pain, chills, ear congestion, ear pain, fever, headaches, heartburn, hemoptysis, myalgias, rash, sore throat, shortness of breath, weight loss or wheezing. Nothing aggravates the symptoms. He has tried nothing for the symptoms. The treatment provided mild relief. There is no history of COPD, emphysema or environmental allergies.    No problem-specific assessment & plan notes found for this encounter.   Past Medical History  Diagnosis Date  . GERD (gastroesophageal reflux disease)   . Hypertension   . Hyperlipidemia   . B12 deficiency   . Stroke ([place])   . Neuromuscular disorder ([place])   . CAD (coronary artery disease)   . COPD (chronic obstructive pulmonary disease) ([place])   . Cancer [facility])     Past Surgical History  Procedure Laterality Date  . Back surgery    . Hernia repair    . Hip replacemnt (r)    . Knee surgery Right   . Colonoscopy  2010    No family history on file.  Social History   Social History  . Marital Status: Married    Spouse Name: N/A  . Number of Children: N/A  . Years of Education: N/A   Occupational History  . Not on file.   Social History Main Topics  . Smoking status: Never Smoker   . Smokeless tobacco: Not on file  . Alcohol Use: No  . Drug Use: No  . Sexual Activity: No   Other Topics Concern  . Not on file   Social History Narrative    Allergies  Allergen Reactions  . Celecoxib      Review of Systems  Constitutional: Negative for fever, chills, weight  loss and malaise/fatigue.  HENT: Positive for postnasal drip. Negative for ear discharge, ear pain and sore throat.   Eyes: Negative for blurred vision.  Respiratory: Positive for cough. Negative for hemoptysis, sputum production, shortness of breath and wheezing.   Cardiovascular: Negative for chest pain, palpitations and leg swelling.  Gastrointestinal: Negative for heartburn, nausea, abdominal pain, diarrhea, constipation, blood in stool and melena.  Genitourinary: Negative for dysuria, urgency, frequency and hematuria.  Musculoskeletal: Negative for myalgias, back pain, joint pain and neck pain.  Skin: Negative for rash.  Neurological: Negative for dizziness, tingling, sensory change, focal weakness and headaches.  Endo/Heme/Allergies: Negative for environmental allergies and polydipsia. Does not bruise/bleed easily.  Psychiatric/Behavioral: Negative for depression and suicidal ideas. The patient is not nervous/anxious and does not have insomnia.      Objective  Filed Vitals:   09/14/15 0802  BP: 130/68  Pulse: 72  Temp: 98.1 F (36.7 C)  Height: 5\' 11"  (1.803 m)  Weight: 163 lb 8 oz (74.163 kg)  SpO2: 95%    Physical Exam  Constitutional: He is oriented to person, place, and time and well-developed, well-nourished, and in no distress.  HENT:  Head: Normocephalic.  Right Ear: External ear normal.  Left Ear: External ear normal.  Nose: Nose normal.  Mouth/Throat: Oropharynx is clear and moist.  Eyes: Conjunctivae  and EOM are normal. Pupils are equal, round, and reactive to light. Right eye exhibits no discharge. Left eye exhibits no discharge. No scleral icterus.  Neck: Normal range of motion. Neck supple. No JVD present. No tracheal deviation present. No thyromegaly present.  Cardiovascular: Normal rate, regular rhythm, normal heart sounds and intact distal pulses.  Exam reveals no gallop and no friction rub.   No murmur heard. Pulmonary/Chest: Breath sounds normal. No  respiratory distress. He has no wheezes. He has no rales.  Abdominal: Soft. Bowel sounds are normal. He exhibits no mass. There is no hepatosplenomegaly. There is no tenderness. There is no rebound, no guarding and no CVA tenderness.  Musculoskeletal: Normal range of motion. He exhibits no edema or tenderness.  Lymphadenopathy:    He has no cervical adenopathy.  Neurological: He is alert and oriented to person, place, and time. He has normal sensation, normal strength, normal reflexes and intact cranial nerves. No cranial nerve deficit.  Skin: Skin is warm. No rash noted.  Psychiatric: Mood and affect normal.  Nursing note and vitals reviewed.     Assessment & Plan  Problem List Items Addressed This Visit      Digestive   B12 deficiency   Relevant Medications   cyanocobalamin ((VITAMIN B-12)) injection 1,000 mcg (Completed)    Other Visit Diagnoses    Bronchitis    -  Primary    Relevant Medications    levofloxacin (LEVAQUIN) 500 MG tablet    Allergic rhinitis, unspecified allergic rhinitis type        Relevant Medications    azelastine (ASTELIN) 0.1 % nasal spray         Dr. Sophiah [facility]olin [facility]  09/14/2015

## 2015-10-14 ENCOUNTER — Other Ambulatory Visit: Payer: Self-pay

## 2015-10-19 ENCOUNTER — Ambulatory Visit (INDEPENDENT_AMBULATORY_CARE_PROVIDER_SITE_OTHER): Payer: Medicare Other

## 2015-10-19 DIAGNOSIS — E538 Deficiency of other specified B group vitamins: Secondary | ICD-10-CM | POA: Diagnosis not present

## 2015-10-19 MED ORDER — CYANOCOBALAMIN 1000 MCG/ML IJ SOLN
1000.0000 ug | Freq: Once | INTRAMUSCULAR | Status: AC
  Administered 2015-10-19: 1000 ug via INTRAMUSCULAR

## 2015-11-02 ENCOUNTER — Ambulatory Visit (INDEPENDENT_AMBULATORY_CARE_PROVIDER_SITE_OTHER): Payer: Medicare Other | Admitting: Family Medicine

## 2015-11-02 ENCOUNTER — Encounter: Payer: Self-pay | Admitting: Family Medicine

## 2015-11-02 VITALS — BP 120/78 | HR 68 | Ht 71.0 in | Wt 162.0 lb

## 2015-11-02 DIAGNOSIS — J0100 Acute maxillary sinusitis, unspecified: Secondary | ICD-10-CM

## 2015-11-02 MED ORDER — AZITHROMYCIN 250 MG PO TABS: ORAL_TABLET | ORAL | Status: DC

## 2015-11-02 NOTE — Progress Notes (Signed)
Name: Alexander Green   MRN: [id number]    DOB: Feb 27, 1929   Date:11/02/2015       Progress Note  Subjective  Chief Complaint  Chief Complaint  Patient presents with  . Sinusitis    R) side of face feels stopped up- worse in am and at night    Sinusitis This is a recurrent problem. The current episode started 1 to 4 weeks ago. The problem has been waxing and waning since onset. There has been no fever. He is experiencing no pain. Associated symptoms include chills, congestion, coughing, ear pain, headaches, sinus pressure and swollen glands. Pertinent negatives include no diaphoresis, hoarse voice, neck pain, shortness of breath, sneezing or sore throat. Past treatments include nothing. The treatment provided mild relief.    No problem-specific assessment & plan notes found for this encounter.   Past Medical History  Diagnosis Date  . GERD (gastroesophageal reflux disease)   . Hypertension   . Hyperlipidemia   . B12 deficiency   . Stroke ([place])   . Neuromuscular disorder ([place])   . CAD (coronary artery disease)   . COPD (chronic obstructive pulmonary disease) ([place])   . Cancer [facility])     Past Surgical History  Procedure Laterality Date  . Back surgery    . Hernia repair    . Hip replacemnt (r)    . Knee surgery Right   . Colonoscopy  2010    History reviewed. No pertinent family history.  Social History   Social History  . Marital Status: Married    Spouse Name: N/A  . Number of Children: N/A  . Years of Education: N/A   Occupational History  . Not on file.   Social History Main Topics  . Smoking status: Never Smoker   . Smokeless tobacco: Not on file  . Alcohol Use: No  . Drug Use: No  . Sexual Activity: No   Other Topics Concern  . Not on file   Social History Narrative    Allergies  Allergen Reactions  . Celecoxib      Review of Systems  Constitutional: Positive for chills. Negative for fever, weight loss, malaise/fatigue and diaphoresis.   HENT: Positive for congestion, ear pain and sinus pressure. Negative for ear discharge, hoarse voice, sneezing and sore throat.   Eyes: Negative for blurred vision.  Respiratory: Positive for cough. Negative for sputum production, shortness of breath and wheezing.   Cardiovascular: Negative for chest pain, palpitations and leg swelling.  Gastrointestinal: Negative for heartburn, nausea, abdominal pain, diarrhea, constipation, blood in stool and melena.  Genitourinary: Negative for dysuria, urgency, frequency and hematuria.  Musculoskeletal: Negative for myalgias, back pain, joint pain and neck pain.  Skin: Negative for rash.  Neurological: Positive for headaches. Negative for dizziness, tingling, sensory change and focal weakness.  Endo/Heme/Allergies: Negative for environmental allergies and polydipsia. Does not bruise/bleed easily.  Psychiatric/Behavioral: Negative for depression and suicidal ideas. The patient is not nervous/anxious and does not have insomnia.      Objective  Filed Vitals:   11/02/15 1524  BP: 120/78  Pulse: 68  Height: 5\' 11"  (1.803 m)  Weight: 162 lb (73.483 kg)    Physical Exam  Constitutional: He is oriented to person, place, and time and well-developed, well-nourished, and in no distress.  HENT:  Head: Normocephalic.  Right Ear: External ear normal.  Left Ear: External ear normal.  Nose: Nose normal.  Mouth/Throat: Oropharynx is clear and moist.  Eyes: Conjunctivae and EOM are normal. Pupils  are equal, round, and reactive to light. Right eye exhibits no discharge. Left eye exhibits no discharge. No scleral icterus.  Neck: Normal range of motion. Neck supple. No JVD present. No tracheal deviation present. No thyromegaly present.  Cardiovascular: Normal rate, regular rhythm, normal heart sounds and intact distal pulses.  Exam reveals no gallop and no friction rub.   No murmur heard. Pulmonary/Chest: Breath sounds normal. No respiratory distress. He has no  wheezes. He has no rales.  Abdominal: Soft. Bowel sounds are normal. He exhibits no mass. There is no hepatosplenomegaly. There is no tenderness. There is no rebound, no guarding and no CVA tenderness.  Musculoskeletal: Normal range of motion. He exhibits no edema or tenderness.  Lymphadenopathy:    He has no cervical adenopathy.  Neurological: He is alert and oriented to person, place, and time. He has normal sensation, normal strength, normal reflexes and intact cranial nerves. No cranial nerve deficit.  Skin: Skin is warm. No rash noted.  Psychiatric: Mood and affect normal.      Assessment & Plan  Problem List Items Addressed This Visit    None    Visit Diagnoses    Acute maxillary sinusitis, recurrence not specified    -  Primary    Relevant Medications    azithromycin (ZITHROMAX) 250 MG tablet         Dr. [facility]  11/02/2015

## 2015-11-17 ENCOUNTER — Ambulatory Visit (INDEPENDENT_AMBULATORY_CARE_PROVIDER_SITE_OTHER): Payer: Medicare Other

## 2015-11-17 DIAGNOSIS — D519 Vitamin B12 deficiency anemia, unspecified: Secondary | ICD-10-CM | POA: Diagnosis not present

## 2015-11-17 MED ORDER — CYANOCOBALAMIN 1000 MCG/ML IJ SOLN
1000.0000 ug | Freq: Once | INTRAMUSCULAR | Status: AC
  Administered 2015-11-17: 1000 ug via INTRAMUSCULAR

## 2015-12-02 ENCOUNTER — Ambulatory Visit (INDEPENDENT_AMBULATORY_CARE_PROVIDER_SITE_OTHER): Payer: Medicare Other | Admitting: Family Medicine

## 2015-12-02 ENCOUNTER — Encounter: Payer: Self-pay | Admitting: Family Medicine

## 2015-12-02 VITALS — BP 118/62 | HR 60 | Ht 71.0 in | Wt 163.0 lb

## 2015-12-02 DIAGNOSIS — E784 Other hyperlipidemia: Secondary | ICD-10-CM

## 2015-12-02 DIAGNOSIS — I2510 Atherosclerotic heart disease of native coronary artery without angina pectoris: Secondary | ICD-10-CM

## 2015-12-02 DIAGNOSIS — I6330 Cerebral infarction due to thrombosis of unspecified cerebral artery: Secondary | ICD-10-CM

## 2015-12-02 DIAGNOSIS — K219 Gastro-esophageal reflux disease without esophagitis: Secondary | ICD-10-CM

## 2015-12-02 DIAGNOSIS — E785 Hyperlipidemia, unspecified: Secondary | ICD-10-CM

## 2015-12-02 DIAGNOSIS — E7849 Other hyperlipidemia: Secondary | ICD-10-CM

## 2015-12-02 DIAGNOSIS — I10 Essential (primary) hypertension: Secondary | ICD-10-CM

## 2015-12-02 DIAGNOSIS — I6300 Cerebral infarction due to thrombosis of unspecified precerebral artery: Secondary | ICD-10-CM

## 2015-12-02 DIAGNOSIS — Z23 Encounter for immunization: Secondary | ICD-10-CM

## 2015-12-02 MED ORDER — CLOPIDOGREL BISULFATE 75 MG PO TABS: ORAL_TABLET | ORAL | Status: DC

## 2015-12-02 MED ORDER — PANTOPRAZOLE SODIUM 40 MG PO TBEC: DELAYED_RELEASE_TABLET | ORAL | Status: DC

## 2015-12-02 MED ORDER — NITROGLYCERIN 0.1 MG/HR TD PT24: MEDICATED_PATCH | TRANSDERMAL | Status: DC

## 2015-12-02 MED ORDER — METOPROLOL SUCCINATE ER 50 MG PO TB24: ORAL_TABLET | ORAL | Status: DC

## 2015-12-02 MED ORDER — SIMVASTATIN 40 MG PO TABS: ORAL_TABLET | ORAL | Status: DC

## 2015-12-02 NOTE — Progress Notes (Signed)
Name: Alexander Green   MRN: [id number]    DOB: 1928/11/29   Date:12/02/2015       Progress Note  Subjective  Chief Complaint  Chief Complaint  Patient presents with  . Hypertension  . Hyperlipidemia  . Gastroesophageal Reflux  . Cerebrovascular Accident    takes Plavix     Hypertension This is a chronic problem. The current episode started more than 1 year ago. The problem has been gradually improving since onset. The problem is controlled. Pertinent negatives include no anxiety, blurred vision, chest pain, headaches, malaise/fatigue, neck pain, orthopnea, palpitations, peripheral edema, PND, shortness of breath or sweats. There are no associated agents to hypertension. There are no known risk factors for coronary artery disease. Past treatments include beta blockers. The current treatment provides mild improvement. There are no compliance problems.  Hypertensive end-organ damage includes CVA. There is no history of angina, kidney disease, CAD/MI, heart failure, left ventricular hypertrophy, PVD, renovascular disease or retinopathy. There is no history of chronic renal disease.  Hyperlipidemia This is a chronic problem. The current episode started more than 1 year ago. The problem is controlled. He has no history of chronic renal disease. Pertinent negatives include no chest pain, focal sensory loss, focal weakness, leg pain, myalgias or shortness of breath. The current treatment provides significant improvement of lipids. There are no compliance problems.   Gastroesophageal Reflux He reports no abdominal pain, no belching, no chest pain, no choking, no coughing, no dysphagia, no early satiety, no globus sensation, no heartburn, no hoarse voice, no nausea, no sore throat, no stridor, no tooth decay, no water brash or no wheezing. This is a chronic problem. The current episode started more than 1 year ago. The problem occurs constantly. The problem has been gradually improving. The symptoms  are aggravated by certain foods. Associated symptoms include anemia. Pertinent negatives include no fatigue, melena, muscle weakness, orthopnea or weight loss. The treatment provided moderate relief.  Cerebrovascular Accident Pertinent negatives include no abdominal pain, chest pain, chills, coughing, fatigue, fever, headaches, myalgias, nausea, neck pain, rash or sore throat. Treatments tried: precventative. The treatment provided moderate relief.    No problem-specific assessment & plan notes found for this encounter.   Past Medical History  Diagnosis Date  . GERD (gastroesophageal reflux disease)   . Hypertension   . Hyperlipidemia   . B12 deficiency   . Stroke ([place])   . Neuromuscular disorder ([place])   . CAD (coronary artery disease)   . COPD (chronic obstructive pulmonary disease) ([place])   . Cancer [facility])     Past Surgical History  Procedure Laterality Date  . Back surgery    . Hernia repair    . Hip replacemnt (r)    . Knee surgery Right   . Colonoscopy  2010    History reviewed. No pertinent family history.  Social History   Social History  . Marital Status: Married    Spouse Name: N/A  . Number of Children: N/A  . Years of Education: N/A   Occupational History  . Not on file.   Social History Main Topics  . Smoking status: Never Smoker   . Smokeless tobacco: Not on file  . Alcohol Use: No  . Drug Use: No  . Sexual Activity: No   Other Topics Concern  . Not on file   Social History Narrative    Allergies  Allergen Reactions  . Celecoxib      Review of Systems  Constitutional: Negative for fever,  chills, weight loss, malaise/fatigue and fatigue.  HENT: Negative for ear discharge, ear pain, hoarse voice and sore throat.   Eyes: Negative for blurred vision.  Respiratory: Negative for cough, sputum production, choking, shortness of breath and wheezing.   Cardiovascular: Negative for chest pain, palpitations, orthopnea, leg swelling and PND.   Gastrointestinal: Negative for heartburn, dysphagia, nausea, abdominal pain, diarrhea, constipation, blood in stool and melena.  Genitourinary: Negative for dysuria, urgency, frequency and hematuria.  Musculoskeletal: Negative for myalgias, back pain, joint pain, muscle weakness and neck pain.  Skin: Negative for rash.  Neurological: Negative for dizziness, tingling, sensory change, focal weakness and headaches.  Endo/Heme/Allergies: Negative for environmental allergies and polydipsia. Does not bruise/bleed easily.  Psychiatric/Behavioral: Negative for depression and suicidal ideas. The patient is not nervous/anxious and does not have insomnia.      Objective  Filed Vitals:   12/02/15 0802  BP: 118/62  Pulse: 60  Height: 5\' 11"  (1.803 m)  Weight: 163 lb (73.936 kg)    Physical Exam  Constitutional: He is oriented to person, place, and time and well-developed, well-nourished, and in no distress.  HENT:  Head: Normocephalic.  Right Ear: External ear normal.  Left Ear: External ear normal.  Nose: Nose normal.  Mouth/Throat: Oropharynx is clear and moist.  Eyes: Conjunctivae and EOM are normal. Pupils are equal, round, and reactive to light. Right eye exhibits no discharge. Left eye exhibits no discharge. No scleral icterus.  Neck: Normal range of motion. Neck supple. No JVD present. No tracheal deviation present. No thyromegaly present.  Cardiovascular: Normal rate, regular rhythm, normal heart sounds and intact distal pulses.  Exam reveals no gallop and no friction rub.   No murmur heard. Pulmonary/Chest: Breath sounds normal. No respiratory distress. He has no wheezes. He has no rales.  Abdominal: Soft. Bowel sounds are normal. He exhibits no mass. There is no hepatosplenomegaly. There is no tenderness. There is no rebound, no guarding and no CVA tenderness.  Musculoskeletal: Normal range of motion. He exhibits no edema or tenderness.  Lymphadenopathy:    He has no cervical  adenopathy.  Neurological: He is alert and oriented to person, place, and time. He has normal sensation, normal strength, normal reflexes and intact cranial nerves. No cranial nerve deficit.  Skin: Skin is warm. No rash noted.  Psychiatric: Mood and affect normal.  Nursing note and vitals reviewed.     Assessment & Plan  Problem List Items Addressed This Visit      Cardiovascular and Mediastinum   Atherosclerosis of coronary artery   Relevant Medications   simvastatin (ZOCOR) 40 MG tablet   nitroGLYCERIN (NITRODUR - DOSED IN MG/24 HR) 0.1 mg/hr patch   metoprolol succinate (TOPROL-XL) 50 MG 24 hr tablet   clopidogrel (PLAVIX) 75 MG tablet   Essential (primary) hypertension   Relevant Medications   simvastatin (ZOCOR) 40 MG tablet   nitroGLYCERIN (NITRODUR - DOSED IN MG/24 HR) 0.1 mg/hr patch   metoprolol succinate (TOPROL-XL) 50 MG 24 hr tablet     Digestive   Gastro-esophageal reflux disease without esophagitis   Relevant Medications   pantoprazole (PROTONIX) 40 MG tablet     Other   Familial multiple lipoprotein-type hyperlipidemia   Relevant Medications   simvastatin (ZOCOR) 40 MG tablet   nitroGLYCERIN (NITRODUR - DOSED IN MG/24 HR) 0.1 mg/hr patch   metoprolol succinate (TOPROL-XL) 50 MG 24 hr tablet   Other Relevant Orders   Lipid Profile   Hepatic Function Panel (6)    Other  Visit Diagnoses    Hyperlipidemia    -  Primary    Relevant Medications    simvastatin (ZOCOR) 40 MG tablet    nitroGLYCERIN (NITRODUR - DOSED IN MG/24 HR) 0.1 mg/hr patch    metoprolol succinate (TOPROL-XL) 50 MG 24 hr tablet    Other Relevant Orders    Hepatic Function Panel (6)    GERD without esophagitis        Relevant Medications    pantoprazole (PROTONIX) 40 MG tablet    Cerebrovascular accident (CVA) due to thrombosis of cerebral artery (HCC)        Relevant Medications    simvastatin (ZOCOR) 40 MG tablet    nitroGLYCERIN (NITRODUR - DOSED IN MG/24 HR) 0.1 mg/hr patch     metoprolol succinate (TOPROL-XL) 50 MG 24 hr tablet    Essential hypertension        Relevant Medications    simvastatin (ZOCOR) 40 MG tablet    nitroGLYCERIN (NITRODUR - DOSED IN MG/24 HR) 0.1 mg/hr patch    metoprolol succinate (TOPROL-XL) 50 MG 24 hr tablet    Other Relevant Orders    Renal Function Panel    Hepatic Function Panel (6)    Cerebrovascular accident (CVA) due to thrombosis of precerebral artery (HCC)        Relevant Medications    simvastatin (ZOCOR) 40 MG tablet    nitroGLYCERIN (NITRODUR - DOSED IN MG/24 HR) 0.1 mg/hr patch    metoprolol succinate (TOPROL-XL) 50 MG 24 hr tablet    clopidogrel (PLAVIX) 75 MG tablet    Need for pneumococcal vaccination        Relevant Orders    Pneumococcal conjugate vaccine 13-valent (Completed)         Dr. Deanna Claassen [facility]  12/02/2015

## 2015-12-03 LAB — HEPATIC FUNCTION PANEL (6)
ALT: 8 IU/L (ref 0–44)
AST: 20 IU/L (ref 0–40)
Alkaline Phosphatase: 60 IU/L (ref 39–117)
BILIRUBIN TOTAL: 0.6 mg/dL (ref 0.0–1.2)
BILIRUBIN, DIRECT: 0.2 mg/dL (ref 0.00–0.40)

## 2015-12-03 LAB — LIPID PANEL
CHOLESTEROL TOTAL: 155 mg/dL (ref 100–199)
Chol/HDL Ratio: 4 ratio units (ref 0.0–5.0)
HDL: 39 mg/dL — AB (ref >39)
LDL Calculated: 95 mg/dL (ref 0–99)
Triglycerides: 106 mg/dL (ref 0–149)
VLDL Cholesterol Cal: 21 mg/dL (ref 5–40)

## 2015-12-03 LAB — RENAL FUNCTION PANEL
ALBUMIN: 4.5 g/dL (ref 3.5–4.7)
BUN/Creatinine Ratio: 10 (ref 10–22)
BUN: 9 mg/dL (ref 8–27)
CALCIUM: 9.5 mg/dL (ref 8.6–10.2)
CHLORIDE: 101 mmol/L (ref 96–106)
CO2: 21 mmol/L (ref 18–29)
Creatinine, Ser: 0.87 mg/dL (ref 0.76–1.27)
GFR calc non Af Amer: 79 mL/min/{1.73_m2} (ref >59)
GFR, EST AFRICAN AMERICAN: 92 mL/min/{1.73_m2} (ref >59)
GLUCOSE: 90 mg/dL (ref 65–99)
PHOSPHORUS: 4.1 mg/dL (ref 2.5–4.5)
POTASSIUM: 4.5 mmol/L (ref 3.5–5.2)
Sodium: 139 mmol/L (ref 134–144)

## 2015-12-16 ENCOUNTER — Ambulatory Visit (INDEPENDENT_AMBULATORY_CARE_PROVIDER_SITE_OTHER): Payer: Medicare Other

## 2015-12-16 DIAGNOSIS — E538 Deficiency of other specified B group vitamins: Secondary | ICD-10-CM | POA: Diagnosis not present

## 2015-12-16 MED ORDER — CYANOCOBALAMIN 1000 MCG/ML IJ SOLN
1000.0000 ug | Freq: Once | INTRAMUSCULAR | Status: AC
  Administered 2015-12-16: 1000 ug via INTRAMUSCULAR

## 2015-12-21 ENCOUNTER — Inpatient Hospital Stay: Payer: Medicare Other | Attending: Internal Medicine | Admitting: Internal Medicine

## 2015-12-21 VITALS — BP 124/60 | HR 57 | Temp 96.7°F | Resp 19 | Wt 164.9 lb

## 2015-12-21 DIAGNOSIS — M21371 Foot drop, right foot: Secondary | ICD-10-CM | POA: Diagnosis not present

## 2015-12-21 DIAGNOSIS — K219 Gastro-esophageal reflux disease without esophagitis: Secondary | ICD-10-CM | POA: Diagnosis not present

## 2015-12-21 DIAGNOSIS — Z79818 Long term (current) use of other agents affecting estrogen receptors and estrogen levels: Secondary | ICD-10-CM | POA: Insufficient documentation

## 2015-12-21 DIAGNOSIS — M21372 Foot drop, left foot: Secondary | ICD-10-CM | POA: Diagnosis not present

## 2015-12-21 DIAGNOSIS — C61 Malignant neoplasm of prostate: Secondary | ICD-10-CM | POA: Diagnosis present

## 2015-12-21 DIAGNOSIS — I2510 Atherosclerotic heart disease of native coronary artery without angina pectoris: Secondary | ICD-10-CM | POA: Insufficient documentation

## 2015-12-21 DIAGNOSIS — R54 Age-related physical debility: Secondary | ICD-10-CM | POA: Diagnosis not present

## 2015-12-21 DIAGNOSIS — R251 Tremor, unspecified: Secondary | ICD-10-CM | POA: Diagnosis not present

## 2015-12-21 DIAGNOSIS — J449 Chronic obstructive pulmonary disease, unspecified: Secondary | ICD-10-CM | POA: Insufficient documentation

## 2015-12-21 DIAGNOSIS — R9721 Rising PSA following treatment for malignant neoplasm of prostate: Secondary | ICD-10-CM | POA: Diagnosis not present

## 2015-12-21 DIAGNOSIS — I10 Essential (primary) hypertension: Secondary | ICD-10-CM | POA: Insufficient documentation

## 2015-12-21 DIAGNOSIS — R39198 Other difficulties with micturition: Secondary | ICD-10-CM | POA: Insufficient documentation

## 2015-12-21 DIAGNOSIS — E785 Hyperlipidemia, unspecified: Secondary | ICD-10-CM | POA: Diagnosis not present

## 2015-12-21 DIAGNOSIS — G629 Polyneuropathy, unspecified: Secondary | ICD-10-CM | POA: Diagnosis not present

## 2015-12-21 DIAGNOSIS — E538 Deficiency of other specified B group vitamins: Secondary | ICD-10-CM | POA: Insufficient documentation

## 2015-12-21 DIAGNOSIS — Z7982 Long term (current) use of aspirin: Secondary | ICD-10-CM | POA: Diagnosis not present

## 2015-12-21 DIAGNOSIS — Z7902 Long term (current) use of antithrombotics/antiplatelets: Secondary | ICD-10-CM | POA: Diagnosis not present

## 2015-12-21 DIAGNOSIS — Z79899 Other long term (current) drug therapy: Secondary | ICD-10-CM | POA: Diagnosis not present

## 2015-12-21 DIAGNOSIS — Z8673 Personal history of transient ischemic attack (TIA), and cerebral infarction without residual deficits: Secondary | ICD-10-CM | POA: Insufficient documentation

## 2015-12-21 NOTE — Progress Notes (Signed)
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  male patient with above history of prostate cancer- currently castrate resistant- on Casodex plus Lupron is here for follow-up.   Patient states that  He follows up with  Urology for his prostate cancer/ Lupron.   As summarized above;  Is noted that patient's PSA has started going up  In March 2016/  And in October 2016 the PSA was up to 7;  And more recently in March 2017 was 14.9.  Patient is currently on Lupron and Casodex/  Testosterone was castrate level.   Patient denies any pain anywhere.  Denies any unusual weight loss.  Denies any nausea vomiting abdominal pain.  He has some difficulty in urination which is chronic.   Patient has chronic tingling and numbness of his feet./ he has foot drop;  And he wears braces on both the ankles.  He walks with a walker.  He had a recent fall.  ROS: A complete 10 point review of system is done which is negative except mentioned above in history of present illness  MEDICAL HISTORY:  Past Medical History  Diagnosis Date  . GERD (gastroesophageal reflux disease)   . Hypertension   . Hyperlipidemia   . B12 deficiency   . Stroke ([place])   . Neuromuscular disorder ([place])   . CAD (coronary artery disease)   . COPD (chronic obstructive pulmonary disease) ([place])   . Cancer [facility])     SURGICAL HISTORY: Past Surgical History  Procedure Laterality Date  . Back surgery    . Hernia repair    . Hip replacemnt (r)    . Knee surgery Right   .  Colonoscopy  2010    SOCIAL HISTORY: Social History   Social History  . Marital Status: Married    Spouse Name: N/A  . Number of Children: N/A  . Years of Education: N/A   Occupational History  . Not on file.   Social History Main Topics  . Smoking status: Never Smoker   . Smokeless tobacco: Not on file  . Alcohol Use: No  . Drug Use: No  . Sexual Activity: No   Other Topics Concern  . Not on file   Social History Narrative    FAMILY HISTORY: No family history on file.  ALLERGIES:  is allergic to celecoxib.  MEDICATIONS:  Current Outpatient Prescriptions  Medication Sig Dispense Refill  . ALPRAZolam (XANAX) 0.5 MG tablet Take 0.5 mg by mouth at bedtime.    Marland Kitchen aspirin 81 MG tablet Take 1 tablet by mouth daily.    Marland Kitchen azelastine (ASTELIN) 0.1 % nasal spray Place 1 spray into both nostrils daily. 30 mL 11  . bicalutamide (CASODEX) 50 MG tablet Take 50 mg by mouth daily. Dr Jacqlyn Larsen    . clopidogrel (PLAVIX) 75 MG tablet TAKE 1 BY MOUTH DAILY 90 tablet 1  . ipratropium (ATROVENT) 0.06 % nasal spray Place 2 sprays into both nostrils 3 (three) times daily. Dr Tami Ribas    . Leuprolide Acetate, 6 Month, (LUPRON DEPOT) 45 MG injection Inject 45 mg as  directed every 6 (six) months. Dr Jacqlyn Larsen    . loratadine (CLARITIN) 10 MG tablet Take 1 tablet by mouth daily.    . metoprolol succinate (TOPROL-XL) 50 MG 24 hr tablet TAKE 1 BY MOUTH DAILY 90 tablet 1  . nitroGLYCERIN (NITRODUR - DOSED IN MG/24 HR) 0.1 mg/hr patch APPLY 1 PATCH TRANSDERMALLY DAILY 90 patch 1  . nystatin cream (MYCOSTATIN) Apply 1 application topically as needed. Reported on 12/02/2015    . pantoprazole (PROTONIX) 40 MG tablet TAKE 1 BY MOUTH DAILY 90 tablet 1  . simvastatin (ZOCOR) 40 MG tablet TAKE 1 BY MOUTH DAILY 90 tablet 1   Current Facility-Administered Medications  Medication Dose Route Frequency Provider Last Rate Last Dose  . cyanocobalamin ((VITAMIN B-12)) injection 1,000 mcg  1,000 mcg Intramuscular Once  Juline Patch, MD          .  PHYSICAL EXAMINATION: ECOG PERFORMANCE STATUS: 2 - Symptomatic, <50% confined to bed  Filed Vitals:   12/21/15 1412  BP: 124/60  Pulse: 57  Temp: 96.7 F (35.9 C)  Resp: 19   Filed Weights   12/21/15 1412  Weight: 164 lb 14.5 oz (74.8 kg)    GENERAL: Frail appearing ; Alert, no distress and comfortable. He has tremors of his upper extremities right more than left. He walks with a walker. He needs 2 persons to help him on the exam table. EYES: no pallor or icterus OROPHARYNX: no thrush or ulceration;   NECK: supple, no masses felt LYMPH:  no palpable lymphadenopathy in the cervical, axillary or inguinal regions LUNGS: clear to auscultation and  No wheeze or crackles HEART/CVS: regular rate & rhythm and no murmurs; Bil LE L>R ABDOMEN: abdomen soft, non-tender and normal bowel sounds Musculoskeletal:no cyanosis of digits and no clubbing  PSYCH: alert & oriented x 3 with fluent speech NEURO: no focal motor/sensory deficits; tremors bil UE R>L.  SKIN:  no rashes or significant lesions  LABORATORY DATA:  I have reviewed the data as listed Lab Results  Component Value Date   WBC 5.7 06/07/2015   HGB 9.6* 03/23/2014   HCT 38.1 06/07/2015   MCV 87 06/07/2015   PLT 145* 06/07/2015    Recent Labs  06/07/15 0932 09/06/15 1004 12/02/15 0838  NA 138  --  139  K 4.2  --  4.5  CL 99  --  101  CO2 23  --  21  GLUCOSE 88  --  90  BUN 11  --  9  CREATININE 0.84 0.90 0.87  CALCIUM 9.8  --  9.5  GFRNONAA 80  --  79  GFRAA 93  --  92  PROT 6.9  --   --   ALBUMIN 4.9*  --  4.5  AST 20  --  20  ALT 10  --  8  ALKPHOS 65  --  60  BILITOT 0.5  --  0.6  BILIDIR 0.21  --  0.20     ASSESSMENT & PLAN:   # CASTRATE RESISTANT PROSTATE CANCER- PSA- 15; testosterone 7 [castrate level]. November 2016 bone scan- mild uptake in the right proximal femur; CT abdomen pelvis 13 mm lymph node- possible metastatic disease.  I reviewed the images myself and  with the family.  # However patient is currently asymptomatic. I would recommend repeating imaging in approximately 6-8 weeks. I will also recommend checking labs- a week prior.  I would also check PSA CBC CMP at that time.  # Patient would benefit from  additional therapy like Zytiga or Xtandi. However if patient has diffuse metastatic disease to the bone- he may be a candidate for xofigo.  # Patient is also candidate for X-geva- given the castrate resistant disease. I would recommend doing it every 3 months. Discussed potential side effects including but not limited to hypocalcemia and osteonecrosis of the jaw. He is recommended take calcium and vitamin D pills.  # Patient follow-up with me in approximately 6-8 weeks; he'll have the scans and blood work done a week before.  The above plan of care was also discussed with Dr. Jacqlyn Larsen.  Thank you Dr.Cope for allowing me to participate in the care of your pleasant patient. Please do not hesitate to contact me with questions or concerns in the interim.  # 60 minutes face-to-face with the patient discussing the above plan of care; more than 50% of time spent on prognosis/ natural history; counseling and coordination.     Cammie Sickle, MD 12/21/2015 3:04 PM

## 2015-12-21 NOTE — Progress Notes (Signed)
New pt for eval of known Prostate Cancer 2008. Rising PSA 14.9. Takes lupron and casodex. No pelvic pain. No back pain. Occ R shoulder pain. Ambulates with a walker. Appetite fair at his norm. No recent weight loss. No blood in urine.

## 2015-12-24 ENCOUNTER — Encounter: Payer: Self-pay | Admitting: *Deleted

## 2016-01-17 ENCOUNTER — Ambulatory Visit (INDEPENDENT_AMBULATORY_CARE_PROVIDER_SITE_OTHER): Payer: Medicare Other

## 2016-01-17 DIAGNOSIS — E538 Deficiency of other specified B group vitamins: Secondary | ICD-10-CM | POA: Diagnosis not present

## 2016-01-17 MED ORDER — CYANOCOBALAMIN 1000 MCG/ML IJ SOLN
1000.0000 ug | Freq: Once | INTRAMUSCULAR | Status: AC
  Administered 2016-01-17: 1000 ug via INTRAMUSCULAR

## 2016-02-03 ENCOUNTER — Ambulatory Visit: Payer: Medicare Other

## 2016-02-03 ENCOUNTER — Ambulatory Visit
Admission: RE | Admit: 2016-02-03 | Discharge: 2016-02-03 | Disposition: A | Discharge status: Home / Self Care | Payer: Medicare Other | Source: Ambulatory Visit | Attending: Internal Medicine | Admitting: Internal Medicine

## 2016-02-03 DIAGNOSIS — Z95828 Presence of other vascular implants and grafts: Secondary | ICD-10-CM | POA: Insufficient documentation

## 2016-02-03 DIAGNOSIS — K449 Diaphragmatic hernia without obstruction or gangrene: Secondary | ICD-10-CM | POA: Diagnosis not present

## 2016-02-03 DIAGNOSIS — Q6102 Congenital multiple renal cysts: Secondary | ICD-10-CM | POA: Insufficient documentation

## 2016-02-03 DIAGNOSIS — J432 Centrilobular emphysema: Secondary | ICD-10-CM | POA: Insufficient documentation

## 2016-02-03 DIAGNOSIS — K8020 Calculus of gallbladder without cholecystitis without obstruction: Secondary | ICD-10-CM | POA: Diagnosis not present

## 2016-02-03 DIAGNOSIS — I714 Abdominal aortic aneurysm, without rupture: Secondary | ICD-10-CM | POA: Diagnosis not present

## 2016-02-03 DIAGNOSIS — R918 Other nonspecific abnormal finding of lung field: Secondary | ICD-10-CM | POA: Insufficient documentation

## 2016-02-03 DIAGNOSIS — R590 Localized enlarged lymph nodes: Secondary | ICD-10-CM | POA: Diagnosis not present

## 2016-02-03 DIAGNOSIS — I2510 Atherosclerotic heart disease of native coronary artery without angina pectoris: Secondary | ICD-10-CM | POA: Diagnosis not present

## 2016-02-03 DIAGNOSIS — R938 Abnormal findings on diagnostic imaging of other specified body structures: Secondary | ICD-10-CM | POA: Insufficient documentation

## 2016-02-03 DIAGNOSIS — K5730 Diverticulosis of large intestine without perforation or abscess without bleeding: Secondary | ICD-10-CM | POA: Insufficient documentation

## 2016-02-03 DIAGNOSIS — R911 Solitary pulmonary nodule: Secondary | ICD-10-CM | POA: Diagnosis not present

## 2016-02-03 DIAGNOSIS — C61 Malignant neoplasm of prostate: Secondary | ICD-10-CM | POA: Insufficient documentation

## 2016-02-03 HISTORY — DX: Malignant neoplasm of prostate: C61

## 2016-02-03 LAB — POCT I-STAT CREATININE: CREATININE: 0.8 mg/dL (ref 0.61–1.24)

## 2016-02-03 MED ORDER — IOPAMIDOL (ISOVUE-300) INJECTION 61%
100.0000 mL | Freq: Once | INTRAVENOUS | Status: AC | PRN
  Administered 2016-02-03: 100 mL via INTRAVENOUS

## 2016-02-04 ENCOUNTER — Encounter
Admission: RE | Admit: 2016-02-04 | Discharge: 2016-02-04 | Disposition: A | Discharge status: Home / Self Care | Payer: Medicare Other | Source: Ambulatory Visit | Attending: Internal Medicine | Admitting: Internal Medicine

## 2016-02-04 DIAGNOSIS — C61 Malignant neoplasm of prostate: Secondary | ICD-10-CM | POA: Diagnosis present

## 2016-02-04 MED ORDER — TECHNETIUM TC 99M MEDRONATE IV KIT
25.0000 | PACK | Freq: Once | INTRAVENOUS | Status: AC | PRN
  Administered 2016-02-04: 22.9 via INTRAVENOUS

## 2016-02-08 ENCOUNTER — Other Ambulatory Visit: Payer: Medicare Other

## 2016-02-08 ENCOUNTER — Other Ambulatory Visit: Payer: Self-pay | Admitting: *Deleted

## 2016-02-08 ENCOUNTER — Inpatient Hospital Stay: Payer: Medicare Other | Attending: Internal Medicine | Admitting: Internal Medicine

## 2016-02-08 ENCOUNTER — Telehealth: Payer: Self-pay | Admitting: *Deleted

## 2016-02-08 VITALS — BP 118/73 | HR 65 | Temp 98.4°F | Wt 163.6 lb

## 2016-02-08 DIAGNOSIS — K8020 Calculus of gallbladder without cholecystitis without obstruction: Secondary | ICD-10-CM | POA: Insufficient documentation

## 2016-02-08 DIAGNOSIS — Z7982 Long term (current) use of aspirin: Secondary | ICD-10-CM | POA: Insufficient documentation

## 2016-02-08 DIAGNOSIS — Z79818 Long term (current) use of other agents affecting estrogen receptors and estrogen levels: Secondary | ICD-10-CM | POA: Insufficient documentation

## 2016-02-08 DIAGNOSIS — R232 Flushing: Secondary | ICD-10-CM | POA: Diagnosis not present

## 2016-02-08 DIAGNOSIS — I2510 Atherosclerotic heart disease of native coronary artery without angina pectoris: Secondary | ICD-10-CM | POA: Insufficient documentation

## 2016-02-08 DIAGNOSIS — Z7902 Long term (current) use of antithrombotics/antiplatelets: Secondary | ICD-10-CM | POA: Insufficient documentation

## 2016-02-08 DIAGNOSIS — Z8673 Personal history of transient ischemic attack (TIA), and cerebral infarction without residual deficits: Secondary | ICD-10-CM | POA: Diagnosis not present

## 2016-02-08 DIAGNOSIS — R39198 Other difficulties with micturition: Secondary | ICD-10-CM | POA: Insufficient documentation

## 2016-02-08 DIAGNOSIS — J449 Chronic obstructive pulmonary disease, unspecified: Secondary | ICD-10-CM | POA: Insufficient documentation

## 2016-02-08 DIAGNOSIS — D649 Anemia, unspecified: Secondary | ICD-10-CM | POA: Diagnosis not present

## 2016-02-08 DIAGNOSIS — R911 Solitary pulmonary nodule: Secondary | ICD-10-CM | POA: Diagnosis not present

## 2016-02-08 DIAGNOSIS — M21379 Foot drop, unspecified foot: Secondary | ICD-10-CM | POA: Insufficient documentation

## 2016-02-08 DIAGNOSIS — R531 Weakness: Secondary | ICD-10-CM

## 2016-02-08 DIAGNOSIS — E538 Deficiency of other specified B group vitamins: Secondary | ICD-10-CM | POA: Insufficient documentation

## 2016-02-08 DIAGNOSIS — C61 Malignant neoplasm of prostate: Secondary | ICD-10-CM | POA: Diagnosis present

## 2016-02-08 DIAGNOSIS — K5730 Diverticulosis of large intestine without perforation or abscess without bleeding: Secondary | ICD-10-CM | POA: Insufficient documentation

## 2016-02-08 DIAGNOSIS — R251 Tremor, unspecified: Secondary | ICD-10-CM | POA: Insufficient documentation

## 2016-02-08 DIAGNOSIS — Z79899 Other long term (current) drug therapy: Secondary | ICD-10-CM | POA: Insufficient documentation

## 2016-02-08 DIAGNOSIS — C772 Secondary and unspecified malignant neoplasm of intra-abdominal lymph nodes: Principal | ICD-10-CM

## 2016-02-08 DIAGNOSIS — R5383 Other fatigue: Secondary | ICD-10-CM | POA: Diagnosis not present

## 2016-02-08 DIAGNOSIS — G629 Polyneuropathy, unspecified: Secondary | ICD-10-CM | POA: Diagnosis not present

## 2016-02-08 DIAGNOSIS — D696 Thrombocytopenia, unspecified: Secondary | ICD-10-CM | POA: Diagnosis not present

## 2016-02-08 DIAGNOSIS — I10 Essential (primary) hypertension: Secondary | ICD-10-CM | POA: Diagnosis not present

## 2016-02-08 DIAGNOSIS — K449 Diaphragmatic hernia without obstruction or gangrene: Secondary | ICD-10-CM | POA: Insufficient documentation

## 2016-02-08 DIAGNOSIS — R590 Localized enlarged lymph nodes: Secondary | ICD-10-CM | POA: Insufficient documentation

## 2016-02-08 DIAGNOSIS — E785 Hyperlipidemia, unspecified: Secondary | ICD-10-CM | POA: Diagnosis not present

## 2016-02-08 DIAGNOSIS — K219 Gastro-esophageal reflux disease without esophagitis: Secondary | ICD-10-CM | POA: Insufficient documentation

## 2016-02-08 DIAGNOSIS — D6489 Other specified anemias: Secondary | ICD-10-CM

## 2016-02-08 DIAGNOSIS — R5381 Other malaise: Secondary | ICD-10-CM | POA: Insufficient documentation

## 2016-02-08 DIAGNOSIS — K5900 Constipation, unspecified: Secondary | ICD-10-CM | POA: Insufficient documentation

## 2016-02-08 LAB — COMPREHENSIVE METABOLIC PANEL
ALBUMIN: 4.7 g/dL (ref 3.5–5.0)
ALT: 13 U/L — ABNORMAL LOW (ref 17–63)
ANION GAP: 5 (ref 5–15)
AST: 27 U/L (ref 15–41)
Alkaline Phosphatase: 58 U/L (ref 38–126)
BUN: 12 mg/dL (ref 6–20)
CALCIUM: 9.1 mg/dL (ref 8.9–10.3)
CO2: 24 mmol/L (ref 22–32)
CREATININE: 0.81 mg/dL (ref 0.61–1.24)
Chloride: 103 mmol/L (ref 101–111)
GFR calc Af Amer: >60 mL/min (ref >60)
Glucose, Bld: 134 mg/dL — ABNORMAL HIGH (ref 65–99)
Potassium: 3.9 mmol/L (ref 3.5–5.1)
Sodium: 132 mmol/L — ABNORMAL LOW (ref 135–145)
Total Bilirubin: 0.6 mg/dL (ref 0.3–1.2)
Total Protein: 7.4 g/dL (ref 6.5–8.1)

## 2016-02-08 LAB — CBC WITH DIFFERENTIAL/PLATELET
BASOS ABS: 0 10*3/uL (ref 0–0.1)
BASOS PCT: 1 %
Eosinophils Absolute: 0.1 10*3/uL (ref 0–0.7)
Eosinophils Relative: 2 %
HEMATOCRIT: 34.1 % — AB (ref 40.0–52.0)
HEMOGLOBIN: 11.4 g/dL — AB (ref 13.0–18.0)
Lymphocytes Relative: 13 %
Lymphs Abs: 0.5 10*3/uL — ABNORMAL LOW (ref 1.0–3.6)
MCH: 27.7 pg (ref 26.0–34.0)
MCHC: 33.3 g/dL (ref 32.0–36.0)
MCV: 83.1 fL (ref 80.0–100.0)
MONO ABS: 0.2 10*3/uL (ref 0.2–1.0)
Monocytes Relative: 6 %
NEUTROS PCT: 78 %
Neutro Abs: 3 10*3/uL (ref 1.4–6.5)
Platelets: 107 10*3/uL — ABNORMAL LOW (ref 150–440)
RBC: 4.1 MIL/uL — ABNORMAL LOW (ref 4.40–5.90)
RDW: 14.9 % — AB (ref 11.5–14.5)
WBC: 3.8 10*3/uL (ref 3.8–10.6)

## 2016-02-08 MED ORDER — ENZALUTAMIDE 40 MG PO CAPS: 120.0000 mg | ORAL_CAPSULE | Freq: Every day | ORAL | Status: DC

## 2016-02-08 NOTE — Addendum Note (Signed)
Addended by: Sabino Gasser on: 02/08/2016 04:46 PM   Modules accepted: Orders

## 2016-02-08 NOTE — Patient Instructions (Signed)
Enzalutamide capsules  What is this medicine?  ENZALUTAMIDE blocks the effect of the male hormone called testosterone. This medicine is used for certain types of prostate cancer.  This medicine may be used for other purposes; ask your health care provider or pharmacist if you have questions.  What should I tell my health care provider before I take this medicine?  They need to know if you have any of these conditions:  -brain tumor  -head injury  -history of stroke  -seizures  -an unusual or allergic reaction to enzalutamide, other medicines, foods, dyes, or preservatives  -pregnant or trying to get pregnant  -breast-feeding  How should I use this medicine?  Take this medicine by mouth with a glass of water. You can take it with or without food. If it upsets your stomach, take it with food. Follow the directions on the prescription label. Do not cut, crush, or chew this medicine. Take your medicine at regular intervals. Do not take it more often than directed. Do not stop taking except on your doctor's advice.  Talk to your pediatrician regarding the use of this medicine in children. Special care may be needed.  Overdosage: If you think you have taken too much of this medicine contact a poison control center or emergency room at once.  NOTE: This medicine is only for you. Do not share this medicine with others.  What if I miss a dose?  If you miss a dose, take it as soon as you can. If it is almost time for your next dose, take only that dose and skip your missed dose. Do not take double or extra doses.  What may interact with this medicine?  This medicine may interact with the following medications:  -alfentanil  -bosentan  -certain medications for seizures like carbamazepine, phenobarbital, and phenytoin  -cyclosporine  -dihydroergotamine  -efavirenz  -ergotamine  -etravirine  -fentanyl  -gemfibrozil  -midazolam  -modafinil  -nafcillin  -omeprazole  -pimozide  -quinidine  -rifabutin  -rifampin  -rifapentine  -St.  John's Wort  -sirolimus  -tacrolimus  -warfarin  This list may not describe all possible interactions. Give your health care provider a list of all the medicines, herbs, non-prescription drugs, or dietary supplements you use. Also tell them if you smoke, drink alcohol, or use illegal drugs. Some items may interact with your medicine.  What should I watch for while using this medicine?  This medicine should not be used in women. Men should not father a child while taking this medicine and for 3 months after stopping it. There is a potential for serious side effects to an unborn child. Talk to your health care professional or pharmacist for more information.  Due to a risk of seizures with enzalutamide therapy, use caution when engaging in activities that could result in serious harm to yourself or others if you pass out.  What side effects may I notice from receiving this medicine?  Side effects that you should report to your doctor or health care professional as soon as possible:  -allergic reactions like skin rash, itching or hives, swelling of the face, lips, or tongue  -changes in vision  -confusion  -falls  -loss of memory  -seizures  -sudden numbness or weakness of the face, arm or leg  -trouble speaking or understanding  -trouble walking, dizziness, loss of balance or coordination  Side effects that usually do not require medical attention (Report these to your doctor or health care professional if they continue or are   bothersome.):  -blood in the urine  -breathing problems  -diarrhea  -dizziness  -headache  -joint pain  -pain, tingling, numbness in the hands or feet  -swelling of the ankles, feet, hands  -unusually weak or tired  This list may not describe all possible side effects. Call your doctor for medical advice about side effects. You may report side effects to FDA at 1-800-FDA-1088.  Where should I keep my medicine?  Keep out of the reach of children.  Store between 20 and 25 degrees C (68 and 77  degrees F). Throw away any unused medicine after the expiration date.  NOTE: This sheet is a summary. It may not cover all possible information. If you have questions about this medicine, talk to your doctor, pharmacist, or health care provider.      2016, [employer]vier/[employer] Standard. (2014-06-01 13:59:24)

## 2016-02-08 NOTE — Telephone Encounter (Signed)
-----   Message from Cammie Sickle, MD sent at 02/08/2016  3:58 PM EDT ----- Please ask lab to run iron studies/ ferritin from today labs; orders are in- Thx

## 2016-02-08 NOTE — Progress Notes (Signed)
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  male patient with above history of prostate cancer- currently castrate resistant- on Casodex plus Lupron is here for follow-up.   Patient is doing fair. He has intermittent pain in the left upper quadrant; currently resolved. Intermittent constipation. Denies any unusual weight loss.  Denies any nausea vomiting.  He has some difficulty in urination which is chronic.  He complains of hot flashes/fatigue. Patient has chronic tingling and numbness of his feet./ he has foot drop;  And he wears braces on both the ankles.  He walks with a walker. No recent falls. No blood in stools or black stools.    ROS: A complete 10 point review of system is done which is negative except mentioned above in history of present illness  MEDICAL HISTORY:  Past Medical History  Diagnosis Date  . GERD (gastroesophageal reflux disease)   . Hypertension   . Hyperlipidemia   . B12 deficiency   . Stroke ([place])   . Neuromuscular disorder ([place])   . CAD (coronary artery disease)   . COPD (chronic obstructive pulmonary disease) ([place])   . Prostate cancer [facility]) 2009    Cryoablation with Dr. Jacqlyn Larsen    SURGICAL HISTORY: Past Surgical History  Procedure Laterality Date  . Back surgery    . Hernia repair    . Hip replacemnt (r)    . Knee surgery Right   . Colonoscopy  2010    SOCIAL HISTORY: Social History   Social History  . Marital Status: Married    Spouse Name: N/A  .  Number of Children: N/A  . Years of Education: N/A   Occupational History  . Not on file.   Social History Main Topics  . Smoking status: Never Smoker   . Smokeless tobacco: Not on file  . Alcohol Use: No  . Drug Use: No  . Sexual Activity: No   Other Topics Concern  . Not on file   Social History Narrative    FAMILY HISTORY: No family history on file.  ALLERGIES:  is allergic to celecoxib.  MEDICATIONS:  Current Outpatient Prescriptions  Medication Sig Dispense Refill  . ALPRAZolam (XANAX) 0.5 MG tablet     . aspirin 81 MG tablet Take 81 mg by mouth.    Marland Kitchen azelastine (ASTELIN) 0.1 % nasal spray Place into the nose.    . bicalutamide (CASODEX) 50 MG tablet Take 50 mg by mouth.    . clopidogrel (PLAVIX) 75 MG tablet Take 75 mg by mouth.    . Cyanocobalamin 1000 MCG/ML KIT Inject as directed.    . Hydrocodone-Acetaminophen 5-300 MG TABS Take by mouth.    Marland Kitchen ipratropium (ATROVENT) 0.06 % nasal spray Place 2 sprays into both nostrils 3 (three) times daily. Dr Tami Ribas    . leuprolide (LUPRON) 11.25 MG KIT injection Inject into the muscle.    Marland Kitchen Leuprolide Acetate, 6 Month, (LUPRON DEPOT) 45 MG injection Inject 45 mg as directed every 6 (six) months. Dr Jacqlyn Larsen    . loratadine (CLARITIN) 10 MG tablet Take  10 mg by mouth.    . metoprolol succinate (TOPROL-XL) 50 MG 24 hr tablet Take 50 mg by mouth.    . mometasone (ELOCON) 0.1 % cream   5  . nitroGLYCERIN (NITRODUR - DOSED IN MG/24 HR) 0.1 mg/hr patch     . nystatin cream (MYCOSTATIN) Apply topically.    . pantoprazole (PROTONIX) 40 MG tablet Take 40 mg by mouth.    . simvastatin (ZOCOR) 40 MG tablet Take 40 mg by mouth.    . TraMADol HCl 100 MG TB24 Take 100 mg by mouth.    . enzalutamide (XTANDI) 40 MG capsule Take 3 capsules (120 mg total) by mouth daily. 120 capsule 0   Current Facility-Administered Medications  Medication Dose Route Frequency Provider Last Rate Last Dose  . cyanocobalamin ((VITAMIN B-12)) injection 1,000 mcg   1,000 mcg Intramuscular Once Juline Patch, MD          .  PHYSICAL EXAMINATION: ECOG PERFORMANCE STATUS: 2 - Symptomatic, <50% confined to bed  Filed Vitals:   02/08/16 1406  BP: 118/73  Pulse: 65  Temp: 98.4 F (36.9 C)   Filed Weights   02/08/16 1406  Weight: 163 lb 9 oz (74.191 kg)    GENERAL: Frail appearing ; Alert, no distress and comfortable. He has tremors of his upper extremities right more than left. He walks with a walker. He needs 2 persons to help him on the exam table. EYES: no pallor or icterus OROPHARYNX: no thrush or ulceration;   NECK: supple, no masses felt LYMPH:  no palpable lymphadenopathy in the cervical, axillary or inguinal regions LUNGS: clear to auscultation and  No wheeze or crackles HEART/CVS: regular rate & rhythm and no murmurs; Bil LE L>R ABDOMEN: abdomen soft, non-tender and normal bowel sounds Musculoskeletal:no cyanosis of digits and no clubbing  PSYCH: alert & oriented x 3 with fluent speech NEURO: no focal motor/sensory deficits; tremors bil UE R>L.  SKIN:  no rashes or significant lesions  LABORATORY DATA:  I have reviewed the data as listed Lab Results  Component Value Date   WBC 3.8 02/08/2016   HGB 11.4* 02/08/2016   HCT 34.1* 02/08/2016   MCV 83.1 02/08/2016   PLT 107* 02/08/2016    Recent Labs  06/07/15 0932  12/02/15 0838 02/03/16 1052 02/08/16 1327  NA 138  --  139  --  132*  K 4.2  --  4.5  --  3.9  CL 99  --  101  --  103  CO2 23  --  21  --  24  GLUCOSE 88  --  90  --  134*  BUN 11  --  9  --  12  CREATININE 0.84  < > 0.87 0.80 0.81  CALCIUM 9.8  --  9.5  --  9.1  GFRNONAA 80  --  79  --  >60  GFRAA 93  --  92  --  >60  PROT 6.9  --   --   --  7.4  ALBUMIN 4.9*  --  4.5  --  4.7  AST 20  --  20  --  27  ALT 10  --  8  --  13*  ALKPHOS 65  --  60  --  58  BILITOT 0.5  --  0.6  --  0.6  BILIDIR 0.21  --  0.20  --   --   < > = values in this interval not displayed. IMPRESSION: Gradual increase in  size  of 11 mm spiculated pulmonary nodule in the posterior right upper lobe compared to previous studies dating back to 2009. Low-grade primary bronchogenic carcinoma cannot be excluded. Consider PET-CT scan for further evaluation.  Increased right paratracheal mediastinal lymphadenopathy, suspicious for metastatic disease.  Increased abdominal retroperitoneal and left common iliac lymphadenopathy, consistent with metastatic disease.  Stable incidental findings including small hiatal hernia, cholelithiasis, and sigmoid diverticulosis.  Stable stent graft repair abdominal aortic aneurysm.   Electronically Signed  By: Earle Gell M.D.  On: 02/03/2016 12:00  ASSESSMENT & PLAN:   # CASTRATE RESISTANT PROSTATE CANCER- March 2017- PSA- 15; testosterone 7 [castrate level].Awaiting PSA from today. Bone scan April 2017- NED; April 2017- CT abdomen pelvis- increase in size of the 13 mm lymph node 1.9 cm; also 10 mm left common iliac lymph node- metastatic disease.  I reviewed the images myself and with the family. As this patient is currently steady clinically- I think giving a trial of X-tandi is reasonable. Discussed the potential side effects of worsening hot flashes/fatigue; and rare increased risk of seizures was also discussed. I will start on 3 pills once a day. New prescription given. Patient will need to come off Casodex prior to starting Xtandi.   # Mediastinal adenopathy- Increasing in size ? Primary prostate vs ? Primary lung [clinically less likely]. Patient states that he was evaluated by pulmonary in the past; high risk for biopsy. We will review the CT scan in tumor conference.   # Right posterior lung nodule- 11 mm [slow growth from 2009]- ? We will review CT scans.  # Patient is also candidate for X-geva- given the castrate resistant disease.   # Chronic Thrombocytopenia unclear etiology > 100,000 monitor for now.  # Mild Anemia- Hb 11; monitor for now. Will order iron  studies/TIBC/ferritin.   # Patient will come back to me in approximately 4 weeks with CBC BMP.  Patient was given a copy of his scans from today.  # 25 minutes face-to-face with the patient discussing the above plan of care; more than 50% of time spent on prognosis/ natural history; counseling and coordination.      Cammie Sickle, MD 02/08/2016 3:10 PM

## 2016-02-08 NOTE — Telephone Encounter (Signed)
Lab called back. Ok to add b12 level to today's lab draw. Pt had a psa level drawn.  Lab orders released.

## 2016-02-08 NOTE — Telephone Encounter (Signed)
RN spoke with Environmental education officer. Md entered orders for iron studies, b12, folate.  Unable to add b12 to labs drawn today. Will add to next lab visit. Iron studies and folate released per md orders.

## 2016-02-09 LAB — PSA: PSA: 24.35 ng/mL — ABNORMAL HIGH (ref 0.00–4.00)

## 2016-02-09 LAB — FERRITIN: FERRITIN: 18 ng/mL — AB (ref 24–336)

## 2016-02-09 LAB — IRON AND TIBC
Iron: 42 ug/dL — ABNORMAL LOW (ref 45–182)
Saturation Ratios: 10 % — ABNORMAL LOW (ref 17.9–39.5)
TIBC: 442 ug/dL (ref 250–450)
UIBC: 400 ug/dL

## 2016-02-09 LAB — FOLATE: FOLATE: 26 ng/mL (ref >5.9)

## 2016-02-09 LAB — VITAMIN B12: Vitamin B-12: 508 pg/mL (ref 180–914)

## 2016-02-10 ENCOUNTER — Other Ambulatory Visit: Payer: Self-pay | Admitting: Internal Medicine

## 2016-02-10 ENCOUNTER — Telehealth: Payer: Self-pay | Admitting: *Deleted

## 2016-02-10 DIAGNOSIS — D509 Iron deficiency anemia, unspecified: Secondary | ICD-10-CM | POA: Insufficient documentation

## 2016-02-10 DIAGNOSIS — C61 Malignant neoplasm of prostate: Secondary | ICD-10-CM

## 2016-02-10 NOTE — Telephone Encounter (Signed)
-----   Message from Cammie Sickle, MD sent at 02/10/2016  8:25 AM EDT ----- Please inform pt that his PSA is going up- from 14 to 24; hence we will plan to start X-tandi as we discussed; also please schedule him for IV iron at next visit with me- Thx

## 2016-02-10 NOTE — Telephone Encounter (Signed)
Message left on voicemail from specialty pharmacy regarding patients Xtandi prescription. Pharmacy is needing clarification of prescription, provider line phone number is [phone].

## 2016-02-10 NOTE — Telephone Encounter (Signed)
-----   Message from Cammie Sickle, MD sent at 02/10/2016  8:14 AM EDT ----- Please inform pt; that iron is low; try iron plls once a day; if not IV iron will be planned at next visit. Stool occult cardsx2 testing. Thx

## 2016-02-10 NOTE — Telephone Encounter (Signed)
Spoke with patient. psa results provided to patient. Will plan to start [place]. Wife will come and pick up stool cards either Monday or Tuesday.  Pt instructed to take  Over the counter iron daily. I encouraged po intake of fluid to avoid constipation. Pt states that he takes miralax at home prn constipation.  We will proceed with IV iron infusion on 5/23 should counts continue to drop.

## 2016-02-11 ENCOUNTER — Telehealth: Payer: Self-pay | Admitting: *Deleted

## 2016-02-11 NOTE — Telephone Encounter (Addendum)
[facility] received rx for xtandi. rx written for Xtandi 40 mg. Take 3 capsules (120 mg total) by mouth daily with a quantity of 120. Need to clarify dosage since md wrote for #120.Marland Kitchen Normal dosage is 4 cap. Daily. I spoke with Dr. Rogue Bussing. He wrote this prescription with 3 capsules a day as patient does not have good performance status. Md ok with changing the quantity to 90.  [facility] called back and notified.

## 2016-02-11 NOTE — Telephone Encounter (Signed)
Copay fr [name] will be $2770.02 per month. They want to know if patient can afford as there is no Copay assistance available. They will fax application for free drug to [place]

## 2016-02-11 NOTE — Telephone Encounter (Signed)
Application for financial assistance received.  I contacted patient and spoke with pt's wife. She and her husband will come on Tuesday in [place] to complete application. I received a msg also from [facility] that pt's insurance approved the prior auth. Pt's wife made aware of this.

## 2016-02-14 ENCOUNTER — Ambulatory Visit (INDEPENDENT_AMBULATORY_CARE_PROVIDER_SITE_OTHER): Payer: Medicare Other

## 2016-02-14 DIAGNOSIS — E538 Deficiency of other specified B group vitamins: Secondary | ICD-10-CM | POA: Diagnosis not present

## 2016-02-14 MED ORDER — CYANOCOBALAMIN 1000 MCG/ML IJ SOLN
1000.0000 ug | Freq: Once | INTRAMUSCULAR | Status: AC
  Administered 2016-02-14: 1000 ug via INTRAMUSCULAR

## 2016-02-15 ENCOUNTER — Other Ambulatory Visit: Payer: Self-pay

## 2016-02-15 ENCOUNTER — Other Ambulatory Visit
Admission: RE | Admit: 2016-02-15 | Discharge: 2016-02-15 | Disposition: A | Discharge status: Home / Self Care | Payer: Medicare Other | Source: Ambulatory Visit | Attending: Internal Medicine | Admitting: Internal Medicine

## 2016-02-15 DIAGNOSIS — C61 Malignant neoplasm of prostate: Secondary | ICD-10-CM

## 2016-02-15 LAB — OCCULT BLOOD X 1 CARD TO LAB, STOOL: FECAL OCCULT BLD: NEGATIVE

## 2016-02-21 ENCOUNTER — Telehealth: Payer: Self-pay | Admitting: *Deleted

## 2016-02-21 NOTE — Telephone Encounter (Signed)
To f/u on the [facility], RN contacted patient and spoke with pt's wife. Pt has not yet heard whether he has been approved for pt's drug financial assistance. RN will contact the Xtandi support solutions.- Left msg to f/u for [facility] for copay assistance.

## 2016-02-23 NOTE — Telephone Encounter (Signed)
Wife had difficulty calling pt advocate foundation. She requested that our office provide the foundation with Dr. Aletha Halim information so that the new forms can be sent to our cancer center for md to sign. I told her that I would take care of this in the morning. I will call the wife back in the morning with feedback from the call to the pt adovcate foundation.

## 2016-02-23 NOTE — Telephone Encounter (Signed)
[place] @ [phone] and they have received Alexander's application.  Per representative co pay assistance program have been trying to contact Alexander with no success.  I have obrtained the information from them and proivided it to Alexander's wife.   She is to contact Alexander Green assistance Program @ [phone], follow prompts of option 1, then option 1, then option 17.  They will ask for information such as demographics, insurance information as well as income information.

## 2016-02-24 ENCOUNTER — Other Ambulatory Visit: Payer: Self-pay | Admitting: Internal Medicine

## 2016-02-24 NOTE — Telephone Encounter (Signed)
Called [facility] to check the status of prescription and they confirmed that the patient has already been approved for co-pay assistance thru the [facility] [place] with a $0 co-pay.  [facility] will be contacted patient to schedule delivery.  I have informed the wife to be expecting a call from [facility] and provided the phone number for her to call if she does not hear from them within 24 hours.

## 2016-02-24 NOTE — Progress Notes (Signed)
[name] case was discussed at tumor conference today; right upper lobe lung nodule slightly increasing in size from 2009. Stable/slightly increased mediastinal adenopathy/pelvic adenopathy- likely related to prostate cancer versus others. Given his poor performance status/multiple comorbidities monitor for now.

## 2016-03-01 ENCOUNTER — Telehealth: Payer: Self-pay | Admitting: *Deleted

## 2016-03-01 NOTE — Telephone Encounter (Signed)
Last med delivery was on May 12 th.

## 2016-03-01 NOTE — Telephone Encounter (Signed)
RN contacted patient. Spoke with patient's wife. Pt rcvd the xtandi on Fri afternoon. Pt started the xtandi on Sunday. He stopped the Casodex as per md instructions. Pt's wife states that she also let Dr. Jacqlyn Larsen know that her husband was no longer on the casodex.

## 2016-03-07 ENCOUNTER — Inpatient Hospital Stay (HOSPITAL_BASED_OUTPATIENT_CLINIC_OR_DEPARTMENT_OTHER): Payer: Medicare Other | Admitting: Internal Medicine

## 2016-03-07 ENCOUNTER — Inpatient Hospital Stay: Payer: Medicare Other | Attending: Internal Medicine

## 2016-03-07 VITALS — BP 130/70 | HR 67 | Temp 97.4°F | Resp 18 | Wt 159.6 lb

## 2016-03-07 DIAGNOSIS — D696 Thrombocytopenia, unspecified: Secondary | ICD-10-CM | POA: Insufficient documentation

## 2016-03-07 DIAGNOSIS — K5730 Diverticulosis of large intestine without perforation or abscess without bleeding: Secondary | ICD-10-CM | POA: Insufficient documentation

## 2016-03-07 DIAGNOSIS — K8020 Calculus of gallbladder without cholecystitis without obstruction: Secondary | ICD-10-CM | POA: Diagnosis not present

## 2016-03-07 DIAGNOSIS — R05 Cough: Secondary | ICD-10-CM

## 2016-03-07 DIAGNOSIS — K219 Gastro-esophageal reflux disease without esophagitis: Secondary | ICD-10-CM | POA: Insufficient documentation

## 2016-03-07 DIAGNOSIS — C61 Malignant neoplasm of prostate: Secondary | ICD-10-CM

## 2016-03-07 DIAGNOSIS — R590 Localized enlarged lymph nodes: Secondary | ICD-10-CM | POA: Diagnosis not present

## 2016-03-07 DIAGNOSIS — R51 Headache: Secondary | ICD-10-CM | POA: Diagnosis not present

## 2016-03-07 DIAGNOSIS — Z79899 Other long term (current) drug therapy: Secondary | ICD-10-CM | POA: Diagnosis not present

## 2016-03-07 DIAGNOSIS — M549 Dorsalgia, unspecified: Secondary | ICD-10-CM | POA: Insufficient documentation

## 2016-03-07 DIAGNOSIS — M21379 Foot drop, unspecified foot: Secondary | ICD-10-CM

## 2016-03-07 DIAGNOSIS — R39198 Other difficulties with micturition: Secondary | ICD-10-CM | POA: Insufficient documentation

## 2016-03-07 DIAGNOSIS — R232 Flushing: Secondary | ICD-10-CM | POA: Insufficient documentation

## 2016-03-07 DIAGNOSIS — R911 Solitary pulmonary nodule: Secondary | ICD-10-CM | POA: Insufficient documentation

## 2016-03-07 DIAGNOSIS — E785 Hyperlipidemia, unspecified: Secondary | ICD-10-CM | POA: Insufficient documentation

## 2016-03-07 DIAGNOSIS — K449 Diaphragmatic hernia without obstruction or gangrene: Secondary | ICD-10-CM | POA: Diagnosis not present

## 2016-03-07 DIAGNOSIS — J449 Chronic obstructive pulmonary disease, unspecified: Secondary | ICD-10-CM | POA: Diagnosis not present

## 2016-03-07 DIAGNOSIS — R0602 Shortness of breath: Secondary | ICD-10-CM

## 2016-03-07 DIAGNOSIS — I10 Essential (primary) hypertension: Secondary | ICD-10-CM | POA: Insufficient documentation

## 2016-03-07 DIAGNOSIS — G629 Polyneuropathy, unspecified: Secondary | ICD-10-CM

## 2016-03-07 DIAGNOSIS — Z79818 Long term (current) use of other agents affecting estrogen receptors and estrogen levels: Secondary | ICD-10-CM | POA: Insufficient documentation

## 2016-03-07 DIAGNOSIS — R251 Tremor, unspecified: Secondary | ICD-10-CM | POA: Diagnosis not present

## 2016-03-07 DIAGNOSIS — D6489 Other specified anemias: Secondary | ICD-10-CM

## 2016-03-07 DIAGNOSIS — C772 Secondary and unspecified malignant neoplasm of intra-abdominal lymph nodes: Secondary | ICD-10-CM

## 2016-03-07 DIAGNOSIS — I2510 Atherosclerotic heart disease of native coronary artery without angina pectoris: Secondary | ICD-10-CM | POA: Diagnosis not present

## 2016-03-07 DIAGNOSIS — D509 Iron deficiency anemia, unspecified: Secondary | ICD-10-CM | POA: Insufficient documentation

## 2016-03-07 DIAGNOSIS — Z7902 Long term (current) use of antithrombotics/antiplatelets: Secondary | ICD-10-CM | POA: Insufficient documentation

## 2016-03-07 LAB — CBC WITH DIFFERENTIAL/PLATELET
BASOS PCT: 1 %
Basophils Absolute: 0 10*3/uL (ref 0–0.1)
EOS ABS: 0.1 10*3/uL (ref 0–0.7)
Eosinophils Relative: 2 %
HCT: 35.7 % — ABNORMAL LOW (ref 40.0–52.0)
HEMOGLOBIN: 12 g/dL — AB (ref 13.0–18.0)
Lymphocytes Relative: 11 %
Lymphs Abs: 0.5 10*3/uL — ABNORMAL LOW (ref 1.0–3.6)
MCH: 28.6 pg (ref 26.0–34.0)
MCHC: 33.7 g/dL (ref 32.0–36.0)
MCV: 84.9 fL (ref 80.0–100.0)
Monocytes Absolute: 0.3 10*3/uL (ref 0.2–1.0)
Monocytes Relative: 7 %
NEUTROS PCT: 79 %
Neutro Abs: 3.8 10*3/uL (ref 1.4–6.5)
PLATELETS: 118 10*3/uL — AB (ref 150–440)
RBC: 4.2 MIL/uL — AB (ref 4.40–5.90)
RDW: 16.5 % — ABNORMAL HIGH (ref 11.5–14.5)
WBC: 4.8 10*3/uL (ref 3.8–10.6)

## 2016-03-07 LAB — COMPREHENSIVE METABOLIC PANEL
ALT: 14 U/L — AB (ref 17–63)
AST: 31 U/L (ref 15–41)
Albumin: 4.7 g/dL (ref 3.5–5.0)
Alkaline Phosphatase: 64 U/L (ref 38–126)
Anion gap: 6 (ref 5–15)
BUN: 10 mg/dL (ref 6–20)
CHLORIDE: 99 mmol/L — AB (ref 101–111)
CO2: 23 mmol/L (ref 22–32)
CREATININE: 0.82 mg/dL (ref 0.61–1.24)
Calcium: 9.3 mg/dL (ref 8.9–10.3)
GFR calc non Af Amer: >60 mL/min (ref >60)
Glucose, Bld: 97 mg/dL (ref 65–99)
POTASSIUM: 4.3 mmol/L (ref 3.5–5.1)
SODIUM: 128 mmol/L — AB (ref 135–145)
Total Bilirubin: 0.9 mg/dL (ref 0.3–1.2)
Total Protein: 7.3 g/dL (ref 6.5–8.1)

## 2016-03-07 MED ORDER — ENZALUTAMIDE 40 MG PO CAPS: 120.0000 mg | ORAL_CAPSULE | Freq: Every day | ORAL | Status: DC

## 2016-03-07 NOTE — Progress Notes (Signed)
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  male patient with above history of prostate cancer- currently castrate resistant- on X-tandi plus Lupron is here for follow-up. He started taking his Xtandi approximately 10 days ago.  He continues to have intermittent hot flashes; not any worse. Denies any seizures. He has intermittent tingling and numbness of his feet/foot drop chronic; denies any new onset of back pain. He has chronic difficulty in urination.  He walks with a walker. No recent falls. No blood in stools or black stools.  He had episode of headache- that resolved spontaneously after a few hours. He has chronic shortness of breath or chronic cough with sputum. No fevers.    ROS: A complete 10 point review of system is done which is negative except mentioned above in history of present illness  MEDICAL HISTORY:  Past Medical History  Diagnosis Date  . GERD (gastroesophageal reflux disease)   . Hypertension   . Hyperlipidemia   . B12 deficiency   . Stroke ([place])   . Neuromuscular disorder ([place])   . CAD (coronary artery disease)   . COPD (chronic obstructive pulmonary disease) ([place])   . Prostate cancer [facility]) 2009    Cryoablation with Dr. Jacqlyn Larsen    SURGICAL HISTORY: Past Surgical History  Procedure Laterality Date  . Back surgery    . Hernia repair    . Hip replacemnt (r)    . Knee surgery Right   . Colonoscopy  2010    SOCIAL HISTORY: Social History   Social History  .  Marital Status: Married    Spouse Name: N/A  . Number of Children: N/A  . Years of Education: N/A   Occupational History  . Not on file.   Social History Main Topics  . Smoking status: Never Smoker   . Smokeless tobacco: Not on file  . Alcohol Use: No  . Drug Use: No  . Sexual Activity: No   Other Topics Concern  . Not on file   Social History Narrative    FAMILY HISTORY: No family history on file.  ALLERGIES:  is allergic to celecoxib.  MEDICATIONS:  Current Outpatient Prescriptions  Medication Sig Dispense Refill  . ALPRAZolam (XANAX) 0.5 MG tablet     . aspirin 81 MG tablet Take 81 mg by mouth.    Marland Kitchen azelastine (ASTELIN) 0.1 % nasal spray Place into the nose.    . cetirizine (ZYRTEC) 10 MG tablet Take 10 mg by mouth daily.    . clopidogrel (PLAVIX) 75 MG tablet Take 75 mg by mouth.    . Cyanocobalamin 1000 MCG/ML KIT Inject as directed.    . enzalutamide (XTANDI) 40 MG capsule Take 3 capsules (120 mg total) by mouth daily. 120 capsule 0  . Hydrocodone-Acetaminophen 5-300 MG TABS Take by mouth.    Marland Kitchen ipratropium (ATROVENT) 0.06 % nasal spray Place 2 sprays into both nostrils 3 (three) times daily. Dr Tami Ribas    . leuprolide (LUPRON) 11.25 MG KIT injection Inject into the muscle.    Marland Kitchen Leuprolide Acetate, 6  Month, (LUPRON DEPOT) 45 MG injection Inject 45 mg as directed every 6 (six) months. Dr Jacqlyn Larsen    . loratadine (CLARITIN) 10 MG tablet Take 10 mg by mouth.    . metoprolol succinate (TOPROL-XL) 50 MG 24 hr tablet Take 50 mg by mouth.    . mometasone (ELOCON) 0.1 % cream   5  . nitroGLYCERIN (NITRODUR - DOSED IN MG/24 HR) 0.1 mg/hr patch     . nystatin cream (MYCOSTATIN) Apply topically.    . simvastatin (ZOCOR) 40 MG tablet Take 40 mg by mouth.    . TraMADol HCl 100 MG TB24 Take 100 mg by mouth.    . pantoprazole (PROTONIX) 40 MG tablet Take 40 mg by mouth. Reported on 03/07/2016     Current Facility-Administered Medications  Medication Dose Route Frequency Provider  Last Rate Last Dose  . cyanocobalamin ((VITAMIN B-12)) injection 1,000 mcg  1,000 mcg Intramuscular Once Juline Patch, MD          .  PHYSICAL EXAMINATION: ECOG PERFORMANCE STATUS: 2 - Symptomatic, <50% confined to bed  Filed Vitals:   03/07/16 0904  BP: 130/70  Pulse: 67  Temp: 97.4 F (36.3 C)  Resp: 18   Filed Weights   03/07/16 0904  Weight: 159 lb 9 oz (72.377 kg)    GENERAL: Frail appearing ; Alert, no distress and comfortable. He has tremors of his upper extremities right more than left. He walks with a walker. He needs 2 persons to help him on the exam table. EYES: no pallor or icterus OROPHARYNX: no thrush or ulceration;   NECK: supple, no masses felt LYMPH:  no palpable lymphadenopathy in the cervical, axillary or inguinal regions LUNGS: clear to auscultation and  No wheeze or crackles HEART/CVS: regular rate & rhythm and no murmurs; Bil LE L>R ABDOMEN: abdomen soft, non-tender and normal bowel sounds Musculoskeletal:no cyanosis of digits and no clubbing  PSYCH: alert & oriented x 3 with fluent speech NEURO: no focal motor/sensory deficits; tremors bil UE R>L.  SKIN:  no rashes or significant lesions  LABORATORY DATA:  I have reviewed the data as listed Lab Results  Component Value Date   WBC 4.8 03/07/2016   HGB 12.0* 03/07/2016   HCT 35.7* 03/07/2016   MCV 84.9 03/07/2016   PLT 118* 03/07/2016    Recent Labs  06/07/15 0932  12/02/15 0838 02/03/16 1052 02/08/16 1327 03/07/16 0847  NA 138  --  139  --  132* 128*  K 4.2  --  4.5  --  3.9 4.3  CL 99  --  101  --  103 99*  CO2 23  --  21  --  24 23  GLUCOSE 88  --  90  --  134* 97  BUN 11  --  9  --  12 10  CREATININE 0.84  < > 0.87 0.80 0.81 0.82  CALCIUM 9.8  --  9.5  --  9.1 9.3  GFRNONAA 80  --  79  --  >60 >60  GFRAA 93  --  92  --  >60 >60  PROT 6.9  --   --   --  7.4 7.3  ALBUMIN 4.9*  --  4.5  --  4.7 4.7  AST 20  --  20  --  27 31  ALT 10  --  8  --  13* 14*  ALKPHOS 65  --  60  --   58 64  BILITOT 0.5  --  0.6  --  0.6 0.9  BILIDIR 0.21  --  0.20  --   --   --   < > = values in this interval not displayed. IMPRESSION: Gradual increase in size of 11 mm spiculated pulmonary nodule in the posterior right upper lobe compared to previous studies dating back to 2009. Low-grade primary bronchogenic carcinoma cannot be excluded. Consider PET-CT scan for further evaluation.  Increased right paratracheal mediastinal lymphadenopathy, suspicious for metastatic disease.  Increased abdominal retroperitoneal and left common iliac lymphadenopathy, consistent with metastatic disease.  Stable incidental findings including small hiatal hernia, cholelithiasis, and sigmoid diverticulosis.  Stable stent graft repair abdominal aortic aneurysm.   Electronically Signed  By: Earle Gell M.D.  On: 02/03/2016 12:00  ASSESSMENT & PLAN:   # CASTRATE RESISTANT PROSTATE CANCER- April 2017- PSA- 24; testosterone 7 [castrate level].  Bone scan April 2017- NED; April 2017- CT abdomen pelvis- increase in size of the 13 mm lymph node 1.9 cm; also 10 mm left common iliac lymph node- metastatic disease. Patient on first line therapy with Xtandi 3 pills once a day mid-May 2017. Tolerating without any major side effects.  # Castrate resistant prostate cancer/high risk for osteoporosis- recommend Xgeva every 3 months. Discussed hypocalcemia and osteonecrosis of the jaw risk with the patient. We will start at next visit.  # Mediastinal adenopathy- Increasing in size ? Primary prostate vs ? Primary lung [clinically less likely]/Right posterior lung nodule- 11 mm [slow growth from 2009]- reviewed at tumor conference; given his multiple comorbidities monitor for now.  # Chronic Thrombocytopenia unclear etiology > 100,000 monitor for now.  # Iron deficiency anemia- unclear etiology status post IV iron hemoglobin improved; hemoglobin up to 12.  # 25 minutes face-to-face with the patient  discussing the above plan of care; more than 50% of time spent on prognosis/ natural history; counseling and coordination.      Cammie Sickle, MD 03/07/2016 9:17 AM

## 2016-03-07 NOTE — Progress Notes (Signed)
Patient states he has been having headaches in the past week.  One day very severe.  Also had an episode last week when his legs got so weak he could not walk.  Had to use wheelchair.  States it only lasted about 15 minutes.  Patient states he has chills almost every day. They have not taken his temp when he is having chills.  States he has a lot of gas.

## 2016-03-08 ENCOUNTER — Telehealth: Payer: Self-pay | Admitting: *Deleted

## 2016-03-08 NOTE — Telephone Encounter (Signed)
[facility] called. RX received for xstandi. md ordered 90 days supply worth of xstandi. Calling to clarify dosing orders.  I spoke with md. MD wrote for 120 caps.  MD started pt out with xstandi 40 mg. Pt is currently instructed to take 3 caps (120 mg) by mouth daily. Dr. Rogue Bussing will gradually increase number of daily capsules once tolerance to drug is determined. MD requesting pt to receive 120 capsules as ordered.  I left this vm msg with [facility].    Samantha from [facility] called back. Insurance will not approve 120 caps.  Pt will be dispensed a total of 90 tablets. Once md changes dose, he will need to write a new rx to send to [facility].  Dr. Rogue Bussing made aware

## 2016-03-16 ENCOUNTER — Ambulatory Visit (INDEPENDENT_AMBULATORY_CARE_PROVIDER_SITE_OTHER): Payer: Medicare Other

## 2016-03-16 DIAGNOSIS — B379 Candidiasis, unspecified: Secondary | ICD-10-CM | POA: Diagnosis not present

## 2016-03-16 DIAGNOSIS — C772 Secondary and unspecified malignant neoplasm of intra-abdominal lymph nodes: Secondary | ICD-10-CM

## 2016-03-16 DIAGNOSIS — D519 Vitamin B12 deficiency anemia, unspecified: Secondary | ICD-10-CM | POA: Diagnosis not present

## 2016-03-16 DIAGNOSIS — C61 Malignant neoplasm of prostate: Secondary | ICD-10-CM | POA: Diagnosis not present

## 2016-03-16 DIAGNOSIS — D6489 Other specified anemias: Secondary | ICD-10-CM

## 2016-03-16 MED ORDER — CYANOCOBALAMIN 1000 MCG/ML IJ SOLN
1000.0000 ug | Freq: Once | INTRAMUSCULAR | Status: AC
  Administered 2016-03-16: 1000 ug via INTRAMUSCULAR

## 2016-03-16 MED ORDER — NYSTATIN 100000 UNIT/GM EX CREA: TOPICAL_CREAM | Freq: Two times a day (BID) | CUTANEOUS | Status: DC

## 2016-03-17 ENCOUNTER — Emergency Department: Payer: Medicare Other

## 2016-03-17 ENCOUNTER — Emergency Department
Admission: EM | Admit: 2016-03-17 | Discharge: 2016-03-17 | Disposition: A | Discharge status: Home / Self Care | Payer: Medicare Other | Source: Home / Self Care | Attending: Emergency Medicine | Admitting: Emergency Medicine

## 2016-03-17 ENCOUNTER — Encounter: Payer: Self-pay | Admitting: Emergency Medicine

## 2016-03-17 DIAGNOSIS — Z8546 Personal history of malignant neoplasm of prostate: Secondary | ICD-10-CM | POA: Insufficient documentation

## 2016-03-17 DIAGNOSIS — R1310 Dysphagia, unspecified: Secondary | ICD-10-CM

## 2016-03-17 DIAGNOSIS — J449 Chronic obstructive pulmonary disease, unspecified: Secondary | ICD-10-CM | POA: Insufficient documentation

## 2016-03-17 DIAGNOSIS — I2510 Atherosclerotic heart disease of native coronary artery without angina pectoris: Secondary | ICD-10-CM

## 2016-03-17 DIAGNOSIS — Z7982 Long term (current) use of aspirin: Secondary | ICD-10-CM

## 2016-03-17 DIAGNOSIS — Z7902 Long term (current) use of antithrombotics/antiplatelets: Secondary | ICD-10-CM

## 2016-03-17 DIAGNOSIS — E785 Hyperlipidemia, unspecified: Secondary | ICD-10-CM

## 2016-03-17 DIAGNOSIS — Z8673 Personal history of transient ischemic attack (TIA), and cerebral infarction without residual deficits: Secondary | ICD-10-CM

## 2016-03-17 DIAGNOSIS — Z79899 Other long term (current) drug therapy: Secondary | ICD-10-CM

## 2016-03-17 DIAGNOSIS — I10 Essential (primary) hypertension: Secondary | ICD-10-CM | POA: Insufficient documentation

## 2016-03-17 LAB — CBC
HEMATOCRIT: 36.2 % — AB (ref 40.0–52.0)
Hemoglobin: 12.7 g/dL — ABNORMAL LOW (ref 13.0–18.0)
MCH: 29.3 pg (ref 26.0–34.0)
MCHC: 35 g/dL (ref 32.0–36.0)
MCV: 83.6 fL (ref 80.0–100.0)
PLATELETS: 129 10*3/uL — AB (ref 150–440)
RBC: 4.33 MIL/uL — ABNORMAL LOW (ref 4.40–5.90)
RDW: 17 % — AB (ref 11.5–14.5)
WBC: 5.4 10*3/uL (ref 3.8–10.6)

## 2016-03-17 LAB — BASIC METABOLIC PANEL
Anion gap: 9 (ref 5–15)
BUN: 8 mg/dL (ref 6–20)
CHLORIDE: 89 mmol/L — AB (ref 101–111)
CO2: 22 mmol/L (ref 22–32)
CREATININE: 0.75 mg/dL (ref 0.61–1.24)
Calcium: 9.1 mg/dL (ref 8.9–10.3)
GFR calc Af Amer: >60 mL/min (ref >60)
GLUCOSE: 118 mg/dL — AB (ref 65–99)
Potassium: 3.8 mmol/L (ref 3.5–5.1)
SODIUM: 120 mmol/L — AB (ref 135–145)

## 2016-03-17 LAB — TROPONIN I: Troponin I: <0.03 ng/mL (ref <0.031)

## 2016-03-17 NOTE — ED Notes (Signed)
Pt tolerated PO intake

## 2016-03-17 NOTE — ED Notes (Signed)
Family at bedside, at present pt complains of no pain, no distress noted, speaking in full clear sentances

## 2016-03-17 NOTE — ED Provider Notes (Signed)
[facility] Emergency Department Provider Note  ____________________________________________   I have reviewed the triage vital signs and the nursing notes.   HISTORY  Chief Complaint Dysphagia    HPI Alexander Green is a 82 y.o. male was recently started on xTandi medication. He states that the very large polys having trouble swallowing it, yesterday folic he got stuck but then went down again this morning. At this time he has no complaints. He states he has a history of acid indigestion 80s taking antacids. He has a history of "burping" and sometimes he burps. However the reason he is here today is that when he swallowed his pills this morning and last night felt like it got "hung up" for a few seconds. He is taking them with water. They show me a pill that he is supposed be taking, it is very large. He has been having trouble with it since they started. The patient at this time has no complaints today was even drink with no difficulty since this morning. He has had no abdominal pain and he has no diarrhea. No chest pain or shortness of breath. He does not feel that the pill is still struck. He has no other complaints.    Past Medical History  Diagnosis Date  . GERD (gastroesophageal reflux disease)   . Hypertension   . Hyperlipidemia   . B12 deficiency   . Stroke ([place])   . Neuromuscular disorder ([place])   . CAD (coronary artery disease)   . COPD (chronic obstructive pulmonary disease) ([place])   . Prostate cancer [facility]) 2009    Cryoablation with Dr. Jacqlyn Larsen    Patient Active Problem List   Diagnosis Date Noted  . Iron deficiency anemia 02/10/2016  . B12 deficiency 09/14/2015  . Atrial tachycardia ([place]) 06/07/2015  . Gastroesophageal reflux disease without esophagitis 06/07/2015  . H/O hypercholesterolemia 06/07/2015  . H/O: HTN (hypertension) 06/07/2015  . H/O malignant neoplasm of prostate 06/07/2015  . 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 Familial multiple lipoprotein-type hyperlipidemia 02/10/2015  . Cerebrovascular disease 02/10/2015  . Atherosclerosis of coronary artery 02/10/2015  . Essential (primary) hypertension 02/10/2015  . Gastro-esophageal reflux disease without esophagitis 02/10/2015  . Bladder outflow obstruction 07/09/2013  . Calculus of kidney 07/01/2012  . 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 CA of prostate ([place]) 06/25/2012  . Neuralgia neuritis, sciatic nerve 06/25/2012    Past Surgical History  Procedure Laterality Date  . Back surgery    . Hernia repair    . Hip replacemnt (r)    . Knee surgery Right   . Colonoscopy  2010    Current Outpatient Rx  Name  Route  Sig  Dispense  Refill  . ALPRAZolam (XANAX) 0.5 MG tablet               . aspirin 81 MG tablet   Oral   Take 81 mg by mouth.         Marland Kitchen azelastine (ASTELIN) 0.1 % nasal spray   Nasal   Place into the nose.         . cetirizine (ZYRTEC) 10 MG tablet   Oral   Take 10 mg by mouth daily.         . clopidogrel (PLAVIX) 75 MG tablet   Oral   Take 75 mg by mouth.         . Cyanocobalamin 1000 MCG/ML KIT   Injection   Inject as directed.         Marland Kitchen  enzalutamide (XTANDI) 40 MG capsule   Oral   Take 3 capsules (120 mg total) by mouth daily.   120 capsule   4   . Hydrocodone-Acetaminophen 5-300 MG TABS   Oral   Take by mouth.         Marland Kitchen ipratropium (ATROVENT) 0.06 % nasal spray   Each Nare   Place 2 sprays into both nostrils 3 (three) times daily. Dr Tami Ribas         . leuprolide (LUPRON) 11.25 MG KIT injection   Intramuscular   Inject into the muscle.         Marland Kitchen Leuprolide Acetate, 6 Month, (LUPRON DEPOT) 45 MG injection   Injection   Inject 45 mg as directed every 6 (six) months. Dr Jacqlyn Larsen         . loratadine (CLARITIN) 10 MG tablet   Oral   Take 10 mg by mouth.         . metoprolol succinate (TOPROL-XL) 50 MG 24 hr tablet   Oral   Take 50 mg by mouth.         .  mometasone (ELOCON) 0.1 % cream            5   . nitroGLYCERIN (NITRODUR - DOSED IN MG/24 HR) 0.1 mg/hr patch               . nystatin cream (MYCOSTATIN)   Topical   Apply topically 2 (two) times daily.   30 g   1   . pantoprazole (PROTONIX) 40 MG tablet   Oral   Take 40 mg by mouth. Reported on 03/07/2016         . simvastatin (ZOCOR) 40 MG tablet   Oral   Take 40 mg by mouth.         . TraMADol HCl 100 MG TB24   Oral   Take 100 mg by mouth.           Allergies Celecoxib  History reviewed. No pertinent family history.  Social History Social History  Substance Use Topics  . Smoking status: Never Smoker   . Smokeless tobacco: None  . Alcohol Use: No    Review of Systems Constitutional: No fever/chills Eyes: No visual changes. ENT: No sore throat. No stiff neck no neck pain Cardiovascular: Denies chest pain. Respiratory: Denies shortness of breath. Gastrointestinal:   no vomiting.  No diarrhea.  No constipation. Genitourinary: Negative for dysuria. Musculoskeletal: Negative lower extremity swelling Skin: Negative for rash. Neurological: Negative for headaches, focal weakness or numbness. 10-point ROS otherwise negative.  ____________________________________________   PHYSICAL EXAM:  VITAL SIGNS: ED Triage Vitals  Enc Vitals Group     BP 03/17/16 1032 129/70 mmHg     Pulse Rate 03/17/16 1032 67     Resp 03/17/16 1032 20     Temp 03/17/16 1032 97.5 F (36.4 C)     Temp Source 03/17/16 1032 Oral     SpO2 03/17/16 1032 100 %     Weight 03/17/16 1032 160 lb (72.576 kg)     Height 03/17/16 1032 6' (1.829 m)     Head Cir --      Peak Flow --      Pain Score 03/17/16 1033 10     Pain Loc --      Pain Edu? --      Excl. in [place]? --     Constitutional: Alert and oriented. Well appearing and in no acute distress. Eyes: Conjunctivae are  normal. PERRL. EOMI. Head: Atraumatic. Nose: No congestion/rhinnorhea. Mouth/Throat: Mucous membranes are  moist.  Oropharynx non-erythematous. Neck: No stridor.   Nontender with no meningismus Cardiovascular: Normal rate, regular rhythm. Grossly normal heart sounds.  Good peripheral circulation. Respiratory: Normal respiratory effort.  No retractions. Lungs CTAB. Abdominal: Soft and nontender. No distention. No guarding no rebound Back:  There is no focal tenderness or step off there is no midline tenderness there are no lesions noted. there is no CVA tenderness Musculoskeletal: No lower extremity tenderness. No joint effusions, no DVT signs strong distal pulses no edema Neurologic:  Normal speech and language. No gross focal neurologic deficits are appreciated.  Skin:  Skin is warm, dry and intact. No rash noted. Psychiatric: Mood and affect are normal. Speech and behavior are normal.  ____________________________________________   LABS (all labs ordered are listed, but only abnormal results are displayed)  Labs Reviewed  BASIC METABOLIC PANEL - Abnormal; Notable for the following:    Sodium 120 (*)    Chloride 89 (*)    Glucose, Bld 118 (*)    All other components within normal limits  CBC - Abnormal; Notable for the following:    RBC 4.33 (*)    Hemoglobin 12.7 (*)    HCT 36.2 (*)    RDW 17.0 (*)    Platelets 129 (*)    All other components within normal limits  TROPONIN I   ____________________________________________  EKG  I personally interpreted any EKGs ordered by me or triage  ____________________________________________  RADIOLOGY  I reviewed any imaging ordered by me or triage that were performed during my shift and, if possible, patient and/or family made aware of any abnormal findings. ____________________________________________   PROCEDURES  Procedure(s) performed: None  Critical Care performed: None  ____________________________________________   INITIAL IMPRESSION / ASSESSMENT AND PLAN / ED COURSE  Pertinent labs & imaging results that were  available during my care of the patient were reviewed by me and considered in my medical decision making (see chart for details).  Patient here with difficulty swallowing a very large pill. I discussed with Dr. Yevette Edwards, his oncologist. He feels the patient should stop taking that medication. I think this will help him. There is no evidence of this reflects ACS PE dissection or any ongoing abdominal pathology such as obstruction, ischemic gut, gallbladder disease and appendicitis etc . He has a completely benign abdomen and is eating and drinking with no difficulty. There is no evidence of ongoing obstruction. We will refer him to GI. Patient advised to stop taking that medication, take all the rest of his medications with fluids, return to the emergency room for any sensation of foreign body that persists or any new or worrisome symptoms. At this time he has no complaints. I've advised him to cut his food into very small portions. ____________________________________________   FINAL CLINICAL IMPRESSION(S) / ED DIAGNOSES  Final diagnoses:  None      This chart was dictated using voice recognition software.  Despite best efforts to proofread,  errors can occur which can change meaning.     Schuyler Amor, MD 03/17/16 1504

## 2016-03-17 NOTE — Discharge Instructions (Signed)
Stop taking [name], Bextra you take all the other medication that you require with plenty of fluid, if you feel that there is a pill stuck in your throat or you cannot swallow liquid or solid food return to the emergency room, follow up closely with GI medicine. Follow closely with her primary care physician. If you have any bleeding, chest pain, shortness breath, abdominal pain, fever, vomiting, vomiting of blood, or you feel worse in any way return to the emergency department

## 2016-03-17 NOTE — ED Notes (Signed)
Pt to ed with c/o difficulty swallowing.  Pt states he was taking pills last night and felt like they were stuck in throat and chest.  Pt reports he has been able to eat and drink since but burning continues.  Pt reports epigastric burning at triage.

## 2016-03-18 ENCOUNTER — Inpatient Hospital Stay
Admission: EM | Admit: 2016-03-18 | Discharge: 2016-03-27 | DRG: 640 | Disposition: A | Discharge status: Hospice | Payer: Medicare Other | Attending: Internal Medicine | Admitting: Internal Medicine

## 2016-03-18 ENCOUNTER — Encounter: Payer: Self-pay | Admitting: *Deleted

## 2016-03-18 ENCOUNTER — Emergency Department: Payer: Medicare Other

## 2016-03-18 DIAGNOSIS — Z96651 Presence of right artificial knee joint: Secondary | ICD-10-CM | POA: Diagnosis present

## 2016-03-18 DIAGNOSIS — E440 Moderate protein-calorie malnutrition: Secondary | ICD-10-CM | POA: Diagnosis present

## 2016-03-18 DIAGNOSIS — R410 Disorientation, unspecified: Secondary | ICD-10-CM

## 2016-03-18 DIAGNOSIS — Z8249 Family history of ischemic heart disease and other diseases of the circulatory system: Secondary | ICD-10-CM

## 2016-03-18 DIAGNOSIS — E871 Hypo-osmolality and hyponatremia: Secondary | ICD-10-CM | POA: Diagnosis not present

## 2016-03-18 DIAGNOSIS — Z7901 Long term (current) use of anticoagulants: Secondary | ICD-10-CM

## 2016-03-18 DIAGNOSIS — E785 Hyperlipidemia, unspecified: Secondary | ICD-10-CM | POA: Diagnosis present

## 2016-03-18 DIAGNOSIS — Z8673 Personal history of transient ischemic attack (TIA), and cerebral infarction without residual deficits: Secondary | ICD-10-CM

## 2016-03-18 DIAGNOSIS — R339 Retention of urine, unspecified: Secondary | ICD-10-CM | POA: Diagnosis not present

## 2016-03-18 DIAGNOSIS — Z7982 Long term (current) use of aspirin: Secondary | ICD-10-CM

## 2016-03-18 DIAGNOSIS — R64 Cachexia: Secondary | ICD-10-CM | POA: Diagnosis present

## 2016-03-18 DIAGNOSIS — T83518A Infection and inflammatory reaction due to other urinary catheter, initial encounter: Secondary | ICD-10-CM | POA: Diagnosis not present

## 2016-03-18 DIAGNOSIS — K219 Gastro-esophageal reflux disease without esophagitis: Secondary | ICD-10-CM | POA: Diagnosis present

## 2016-03-18 DIAGNOSIS — Y846 Urinary catheterization as the cause of abnormal reaction of the patient, or of later complication, without mention of misadventure at the time of the procedure: Secondary | ICD-10-CM | POA: Diagnosis not present

## 2016-03-18 DIAGNOSIS — I2510 Atherosclerotic heart disease of native coronary artery without angina pectoris: Secondary | ICD-10-CM | POA: Diagnosis present

## 2016-03-18 DIAGNOSIS — E538 Deficiency of other specified B group vitamins: Secondary | ICD-10-CM | POA: Diagnosis present

## 2016-03-18 DIAGNOSIS — G629 Polyneuropathy, unspecified: Secondary | ICD-10-CM | POA: Diagnosis present

## 2016-03-18 DIAGNOSIS — Z515 Encounter for palliative care: Secondary | ICD-10-CM | POA: Diagnosis not present

## 2016-03-18 DIAGNOSIS — G9341 Metabolic encephalopathy: Secondary | ICD-10-CM | POA: Diagnosis present

## 2016-03-18 DIAGNOSIS — J9601 Acute respiratory failure with hypoxia: Secondary | ICD-10-CM | POA: Diagnosis not present

## 2016-03-18 DIAGNOSIS — Z8042 Family history of malignant neoplasm of prostate: Secondary | ICD-10-CM

## 2016-03-18 DIAGNOSIS — Z96641 Presence of right artificial hip joint: Secondary | ICD-10-CM | POA: Diagnosis present

## 2016-03-18 DIAGNOSIS — C61 Malignant neoplasm of prostate: Secondary | ICD-10-CM | POA: Diagnosis present

## 2016-03-18 DIAGNOSIS — N390 Urinary tract infection, site not specified: Secondary | ICD-10-CM | POA: Diagnosis not present

## 2016-03-18 DIAGNOSIS — Z800 Family history of malignant neoplasm of digestive organs: Secondary | ICD-10-CM

## 2016-03-18 DIAGNOSIS — Z66 Do not resuscitate: Secondary | ICD-10-CM | POA: Diagnosis not present

## 2016-03-18 DIAGNOSIS — R911 Solitary pulmonary nodule: Secondary | ICD-10-CM | POA: Diagnosis present

## 2016-03-18 DIAGNOSIS — R1310 Dysphagia, unspecified: Secondary | ICD-10-CM | POA: Diagnosis present

## 2016-03-18 DIAGNOSIS — J449 Chronic obstructive pulmonary disease, unspecified: Secondary | ICD-10-CM | POA: Diagnosis present

## 2016-03-18 DIAGNOSIS — Z681 Body mass index (BMI) 19 or less, adult: Secondary | ICD-10-CM

## 2016-03-18 DIAGNOSIS — Z79899 Other long term (current) drug therapy: Secondary | ICD-10-CM

## 2016-03-18 DIAGNOSIS — E876 Hypokalemia: Secondary | ICD-10-CM | POA: Diagnosis not present

## 2016-03-18 DIAGNOSIS — I10 Essential (primary) hypertension: Secondary | ICD-10-CM | POA: Diagnosis present

## 2016-03-18 LAB — URINALYSIS COMPLETE WITH MICROSCOPIC (ARMC ONLY)
Bilirubin Urine: NEGATIVE
GLUCOSE, UA: NEGATIVE mg/dL
Ketones, ur: NEGATIVE mg/dL
Leukocytes, UA: NEGATIVE
Nitrite: NEGATIVE
PROTEIN: 30 mg/dL — AB
SQUAMOUS EPITHELIAL / LPF: NONE SEEN
Specific Gravity, Urine: 1.008 (ref 1.005–1.030)
pH: 7 (ref 5.0–8.0)

## 2016-03-18 NOTE — ED Notes (Signed)
Patient transported to X-ray 

## 2016-03-18 NOTE — ED Notes (Addendum)
Pt to ED via EMS from home with urinary retention and painful urination that began today. Pt hx of prostate cancer. Pt AAOx4, vitals stable, NAD noted. Bladder scan on arrival >1000. MD Marcelene Butte made aware, orders given to insert foley cath.

## 2016-03-19 ENCOUNTER — Encounter: Payer: Self-pay | Admitting: Internal Medicine

## 2016-03-19 ENCOUNTER — Inpatient Hospital Stay: Payer: Medicare Other

## 2016-03-19 ENCOUNTER — Emergency Department: Payer: Medicare Other

## 2016-03-19 DIAGNOSIS — J449 Chronic obstructive pulmonary disease, unspecified: Secondary | ICD-10-CM | POA: Diagnosis present

## 2016-03-19 DIAGNOSIS — R338 Other retention of urine: Secondary | ICD-10-CM | POA: Diagnosis not present

## 2016-03-19 DIAGNOSIS — R1310 Dysphagia, unspecified: Secondary | ICD-10-CM | POA: Diagnosis present

## 2016-03-19 DIAGNOSIS — Z800 Family history of malignant neoplasm of digestive organs: Secondary | ICD-10-CM | POA: Diagnosis not present

## 2016-03-19 DIAGNOSIS — I2510 Atherosclerotic heart disease of native coronary artery without angina pectoris: Secondary | ICD-10-CM | POA: Diagnosis present

## 2016-03-19 DIAGNOSIS — K219 Gastro-esophageal reflux disease without esophagitis: Secondary | ICD-10-CM | POA: Diagnosis present

## 2016-03-19 DIAGNOSIS — R911 Solitary pulmonary nodule: Secondary | ICD-10-CM | POA: Diagnosis present

## 2016-03-19 DIAGNOSIS — Z66 Do not resuscitate: Secondary | ICD-10-CM | POA: Diagnosis not present

## 2016-03-19 DIAGNOSIS — Z7982 Long term (current) use of aspirin: Secondary | ICD-10-CM | POA: Diagnosis not present

## 2016-03-19 DIAGNOSIS — Z681 Body mass index (BMI) 19 or less, adult: Secondary | ICD-10-CM | POA: Diagnosis not present

## 2016-03-19 DIAGNOSIS — T83518A Infection and inflammatory reaction due to other urinary catheter, initial encounter: Secondary | ICD-10-CM | POA: Diagnosis not present

## 2016-03-19 DIAGNOSIS — E785 Hyperlipidemia, unspecified: Secondary | ICD-10-CM | POA: Diagnosis present

## 2016-03-19 DIAGNOSIS — C61 Malignant neoplasm of prostate: Secondary | ICD-10-CM

## 2016-03-19 DIAGNOSIS — G629 Polyneuropathy, unspecified: Secondary | ICD-10-CM | POA: Diagnosis present

## 2016-03-19 DIAGNOSIS — I10 Essential (primary) hypertension: Secondary | ICD-10-CM | POA: Diagnosis present

## 2016-03-19 DIAGNOSIS — Z7901 Long term (current) use of anticoagulants: Secondary | ICD-10-CM | POA: Diagnosis not present

## 2016-03-19 DIAGNOSIS — E871 Hypo-osmolality and hyponatremia: Secondary | ICD-10-CM | POA: Diagnosis not present

## 2016-03-19 DIAGNOSIS — E440 Moderate protein-calorie malnutrition: Secondary | ICD-10-CM | POA: Diagnosis present

## 2016-03-19 DIAGNOSIS — R339 Retention of urine, unspecified: Secondary | ICD-10-CM | POA: Diagnosis present

## 2016-03-19 DIAGNOSIS — Z8673 Personal history of transient ischemic attack (TIA), and cerebral infarction without residual deficits: Secondary | ICD-10-CM | POA: Diagnosis not present

## 2016-03-19 DIAGNOSIS — E876 Hypokalemia: Secondary | ICD-10-CM | POA: Diagnosis not present

## 2016-03-19 DIAGNOSIS — Z8249 Family history of ischemic heart disease and other diseases of the circulatory system: Secondary | ICD-10-CM | POA: Diagnosis not present

## 2016-03-19 DIAGNOSIS — E538 Deficiency of other specified B group vitamins: Secondary | ICD-10-CM | POA: Diagnosis present

## 2016-03-19 DIAGNOSIS — Z79899 Other long term (current) drug therapy: Secondary | ICD-10-CM | POA: Diagnosis not present

## 2016-03-19 DIAGNOSIS — Y846 Urinary catheterization as the cause of abnormal reaction of the patient, or of later complication, without mention of misadventure at the time of the procedure: Secondary | ICD-10-CM | POA: Diagnosis not present

## 2016-03-19 DIAGNOSIS — R64 Cachexia: Secondary | ICD-10-CM | POA: Diagnosis present

## 2016-03-19 DIAGNOSIS — G9341 Metabolic encephalopathy: Secondary | ICD-10-CM | POA: Diagnosis present

## 2016-03-19 DIAGNOSIS — J9601 Acute respiratory failure with hypoxia: Secondary | ICD-10-CM | POA: Diagnosis not present

## 2016-03-19 DIAGNOSIS — N390 Urinary tract infection, site not specified: Secondary | ICD-10-CM | POA: Diagnosis not present

## 2016-03-19 DIAGNOSIS — Z8042 Family history of malignant neoplasm of prostate: Secondary | ICD-10-CM | POA: Diagnosis not present

## 2016-03-19 DIAGNOSIS — Z96641 Presence of right artificial hip joint: Secondary | ICD-10-CM | POA: Diagnosis present

## 2016-03-19 DIAGNOSIS — Z515 Encounter for palliative care: Secondary | ICD-10-CM | POA: Diagnosis not present

## 2016-03-19 DIAGNOSIS — Z96651 Presence of right artificial knee joint: Secondary | ICD-10-CM | POA: Diagnosis present

## 2016-03-19 LAB — CBC
HCT: 34.5 % — ABNORMAL LOW (ref 40.0–52.0)
Hemoglobin: 12.3 g/dL — ABNORMAL LOW (ref 13.0–18.0)
MCH: 29.2 pg (ref 26.0–34.0)
MCHC: 35.6 g/dL (ref 32.0–36.0)
MCV: 82 fL (ref 80.0–100.0)
PLATELETS: 137 10*3/uL — AB (ref 150–440)
RBC: 4.2 MIL/uL — ABNORMAL LOW (ref 4.40–5.90)
RDW: 17 % — AB (ref 11.5–14.5)
WBC: 8.6 10*3/uL (ref 3.8–10.6)

## 2016-03-19 LAB — COMPREHENSIVE METABOLIC PANEL
ALK PHOS: 58 U/L (ref 38–126)
ALT: 14 U/L — ABNORMAL LOW (ref 17–63)
ANION GAP: 12 (ref 5–15)
AST: 33 U/L (ref 15–41)
Albumin: 4 g/dL (ref 3.5–5.0)
BILIRUBIN TOTAL: 1.4 mg/dL — AB (ref 0.3–1.2)
BUN: 11 mg/dL (ref 6–20)
CALCIUM: 8.8 mg/dL — AB (ref 8.9–10.3)
CO2: 17 mmol/L — ABNORMAL LOW (ref 22–32)
Chloride: 86 mmol/L — ABNORMAL LOW (ref 101–111)
Creatinine, Ser: 0.76 mg/dL (ref 0.61–1.24)
GFR calc Af Amer: >60 mL/min (ref >60)
GLUCOSE: 131 mg/dL — AB (ref 65–99)
POTASSIUM: 4 mmol/L (ref 3.5–5.1)
Sodium: 115 mmol/L — CL (ref 135–145)
TOTAL PROTEIN: 6.1 g/dL — AB (ref 6.5–8.1)

## 2016-03-19 LAB — BASIC METABOLIC PANEL
ANION GAP: 7 (ref 5–15)
BUN: 8 mg/dL (ref 6–20)
CALCIUM: 8.6 mg/dL — AB (ref 8.9–10.3)
CO2: 22 mmol/L (ref 22–32)
CREATININE: 0.82 mg/dL (ref 0.61–1.24)
Chloride: 94 mmol/L — ABNORMAL LOW (ref 101–111)
Glucose, Bld: 107 mg/dL — ABNORMAL HIGH (ref 65–99)
Potassium: 4 mmol/L (ref 3.5–5.1)
SODIUM: 123 mmol/L — AB (ref 135–145)

## 2016-03-19 LAB — TSH: TSH: 3.733 u[IU]/mL (ref 0.350–4.500)

## 2016-03-19 LAB — OSMOLALITY, URINE: OSMOLALITY UR: 254 mosm/kg — AB (ref 300–900)

## 2016-03-19 LAB — OSMOLALITY: Osmolality: 241 mOsm/kg — CL (ref 275–295)

## 2016-03-19 LAB — TROPONIN I

## 2016-03-19 MED ORDER — SODIUM CHLORIDE 0.9 % IV SOLN
Freq: Once | INTRAVENOUS | Status: AC
  Administered 2016-03-19: 01:00:00 via INTRAVENOUS

## 2016-03-19 MED ORDER — SIMVASTATIN 40 MG PO TABS
40.0000 mg | ORAL_TABLET | Freq: Every day | ORAL | Status: DC
  Administered 2016-03-19 – 2016-03-22 (×4): 40 mg via ORAL
  Filled 2016-03-19 (×4): qty 1

## 2016-03-19 MED ORDER — ENOXAPARIN SODIUM 40 MG/0.4ML ~~LOC~~ SOLN
40.0000 mg | SUBCUTANEOUS | Status: DC
  Administered 2016-03-19 – 2016-03-23 (×5): 40 mg via SUBCUTANEOUS
  Filled 2016-03-19 (×5): qty 0.4

## 2016-03-19 MED ORDER — ASPIRIN 81 MG PO CHEW
81.0000 mg | CHEWABLE_TABLET | Freq: Every day | ORAL | Status: DC
  Administered 2016-03-19 – 2016-03-23 (×5): 81 mg via ORAL
  Filled 2016-03-19 (×5): qty 1

## 2016-03-19 MED ORDER — ONDANSETRON HCL 4 MG/2ML IJ SOLN: 4.0000 mg | Freq: Four times a day (QID) | INTRAMUSCULAR | Status: DC | PRN

## 2016-03-19 MED ORDER — METOPROLOL SUCCINATE ER 50 MG PO TB24
50.0000 mg | ORAL_TABLET | Freq: Every day | ORAL | Status: DC
  Administered 2016-03-19 – 2016-03-23 (×5): 50 mg via ORAL
  Filled 2016-03-19 (×5): qty 1

## 2016-03-19 MED ORDER — ACETAMINOPHEN 325 MG PO TABS: 650.0000 mg | ORAL_TABLET | Freq: Four times a day (QID) | ORAL | Status: DC | PRN

## 2016-03-19 MED ORDER — TAMSULOSIN HCL 0.4 MG PO CAPS
0.4000 mg | ORAL_CAPSULE | Freq: Every day | ORAL | Status: DC
  Administered 2016-03-19 – 2016-03-23 (×5): 0.4 mg via ORAL
  Filled 2016-03-19 (×5): qty 1

## 2016-03-19 MED ORDER — ACETAMINOPHEN 650 MG RE SUPP: 650.0000 mg | Freq: Four times a day (QID) | RECTAL | Status: DC | PRN

## 2016-03-19 MED ORDER — PANTOPRAZOLE SODIUM 40 MG PO TBEC
40.0000 mg | DELAYED_RELEASE_TABLET | Freq: Every day | ORAL | Status: DC
  Administered 2016-03-19: 40 mg via ORAL
  Filled 2016-03-19 (×2): qty 1

## 2016-03-19 MED ORDER — ENZALUTAMIDE 40 MG PO CAPS: 120.0000 mg | ORAL_CAPSULE | Freq: Every day | ORAL | Status: DC

## 2016-03-19 MED ORDER — NITROGLYCERIN 0.1 MG/HR TD PT24
0.1000 mg | MEDICATED_PATCH | Freq: Every day | TRANSDERMAL | Status: DC
  Administered 2016-03-19 – 2016-03-24 (×6): 0.1 mg via TRANSDERMAL
  Filled 2016-03-19 (×7): qty 1

## 2016-03-19 MED ORDER — ONDANSETRON HCL 4 MG PO TABS: 4.0000 mg | ORAL_TABLET | Freq: Four times a day (QID) | ORAL | Status: DC | PRN

## 2016-03-19 MED ORDER — SODIUM CHLORIDE 0.9 % IV SOLN
INTRAVENOUS | Status: AC
  Administered 2016-03-19: 03:00:00 via INTRAVENOUS

## 2016-03-19 MED ORDER — SODIUM CHLORIDE 0.9 % IV SOLN
INTRAVENOUS | Status: AC
  Administered 2016-03-19: 13:00:00 via INTRAVENOUS

## 2016-03-19 MED ORDER — HALOPERIDOL LACTATE 5 MG/ML IJ SOLN
2.0000 mg | Freq: Four times a day (QID) | INTRAMUSCULAR | Status: DC | PRN
Start: 2016-03-19 — End: 2016-03-24
  Administered 2016-03-20 – 2016-03-24 (×3): 2 mg via INTRAVENOUS
  Filled 2016-03-19 (×2): qty 1

## 2016-03-19 MED ORDER — CLOPIDOGREL BISULFATE 75 MG PO TABS
75.0000 mg | ORAL_TABLET | Freq: Every day | ORAL | Status: DC
  Administered 2016-03-19 – 2016-03-23 (×5): 75 mg via ORAL
  Filled 2016-03-19 (×5): qty 1

## 2016-03-19 MED ORDER — ALPRAZOLAM 0.5 MG PO TABS
0.5000 mg | ORAL_TABLET | Freq: Every evening | ORAL | Status: DC | PRN
  Administered 2016-03-19 – 2016-03-20 (×2): 0.5 mg via ORAL
  Filled 2016-03-19 (×2): qty 1

## 2016-03-19 NOTE — Progress Notes (Signed)
Nitro patch removed at 0645 per family request, MD aware

## 2016-03-19 NOTE — ED Provider Notes (Signed)
[facility] Emergency Department Provider Note   ____________________________________________  Time seen: Approximately 2325 PM  I have reviewed the triage vital signs and the nursing notes.   HISTORY  Chief Complaint Urinary Retention    HPI Alexander Green is a 82 y.o. male who comes into the hospital today with some urinary retention. The patient's wife reports that he has been unable to urinate all day. She also reports that he's been disoriented. She reports that last night every time he went to urinate it would just come in dribbles. She also reports some disorientation began last night. She reports it seems worse today. The patient is confused and just not acting himself. She denies any fevers, nausea or vomiting, cough or runny nose. The patient has mucus in his throat and has been spitting up mucus a lot. He was here yesterday after taking a chemotherapy pill that was stuck in his throat. His wife reports that he's had balance issues for years and he had a small stroke in the 55s. He is here for evaluation.   Past Medical History  Diagnosis Date  . GERD (gastroesophageal reflux disease)   . Hypertension   . Hyperlipidemia   . B12 deficiency   . Stroke ([place])   . Neuromuscular disorder ([place])   . CAD (coronary artery disease)   . COPD (chronic obstructive pulmonary disease) ([place])   . Prostate cancer [facility]) 2009    Cryoablation with Dr. Jacqlyn Larsen    Patient Active Problem List   Diagnosis Date Noted  . Iron deficiency anemia 02/10/2016  . B12 deficiency 09/14/2015  . Atrial tachycardia ([place]) 06/07/2015  . Gastroesophageal reflux disease without esophagitis 06/07/2015  . H/O hypercholesterolemia 06/07/2015  . H/O: HTN (hypertension) 06/07/2015  . H/O malignant neoplasm of prostate 06/07/2015  . 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 Familial multiple lipoprotein-type hyperlipidemia 02/10/2015  . Cerebrovascular disease 02/10/2015   . Atherosclerosis of coronary artery 02/10/2015  . Essential (primary) hypertension 02/10/2015  . Gastro-esophageal reflux disease without esophagitis 02/10/2015  . Bladder outflow obstruction 07/09/2013  . Calculus of kidney 07/01/2012  . 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 CA of prostate ([place]) 06/25/2012  . Neuralgia neuritis, sciatic nerve 06/25/2012    Past Surgical History  Procedure Laterality Date  . Back surgery    . Hernia repair    . Hip replacemnt (r)    . Knee surgery Right   . Colonoscopy  2010    Current Outpatient Rx  Name  Route  Sig  Dispense  Refill  . ALPRAZolam (XANAX) 0.5 MG tablet               . aspirin 81 MG tablet   Oral   Take 81 mg by mouth.         Marland Kitchen azelastine (ASTELIN) 0.1 % nasal spray   Nasal   Place into the nose.         . cetirizine (ZYRTEC) 10 MG tablet   Oral   Take 10 mg by mouth daily.         . clopidogrel (PLAVIX) 75 MG tablet   Oral   Take 75 mg by mouth.         . Cyanocobalamin 1000 MCG/ML KIT   Injection   Inject as directed.         . enzalutamide (XTANDI) 40 MG capsule   Oral   Take 3 capsules (120 mg total) by mouth daily.  120 capsule   4   . Hydrocodone-Acetaminophen 5-300 MG TABS   Oral   Take by mouth.         Marland Kitchen ipratropium (ATROVENT) 0.06 % nasal spray   Each Nare   Place 2 sprays into both nostrils 3 (three) times daily. Dr Tami Ribas         . leuprolide (LUPRON) 11.25 MG KIT injection   Intramuscular   Inject into the muscle.         Marland Kitchen Leuprolide Acetate, 6 Month, (LUPRON DEPOT) 45 MG injection   Injection   Inject 45 mg as directed every 6 (six) months. Dr Jacqlyn Larsen         . loratadine (CLARITIN) 10 MG tablet   Oral   Take 10 mg by mouth.         . metoprolol succinate (TOPROL-XL) 50 MG 24 hr tablet   Oral   Take 50 mg by mouth.         . mometasone (ELOCON) 0.1 % cream            5   . nitroGLYCERIN (NITRODUR - DOSED IN MG/24 HR) 0.1  mg/hr patch               . nystatin cream (MYCOSTATIN)   Topical   Apply topically 2 (two) times daily.   30 g   1   . pantoprazole (PROTONIX) 40 MG tablet   Oral   Take 40 mg by mouth. Reported on 03/07/2016         . simvastatin (ZOCOR) 40 MG tablet   Oral   Take 40 mg by mouth.         . TraMADol HCl 100 MG TB24   Oral   Take 100 mg by mouth.           Allergies Celecoxib  History reviewed. No pertinent family history.  Social History Social History  Substance Use Topics  . Smoking status: Never Smoker   . Smokeless tobacco: None  . Alcohol Use: No    Review of Systems Constitutional: No fever/chills Eyes: No visual changes. ENT: No sore throat. Cardiovascular: Denies chest pain. Respiratory: Denies shortness of breath. Gastrointestinal: No abdominal pain.  No nausea, no vomiting.  No diarrhea.  No constipation. Genitourinary: Urinary retention Musculoskeletal: Negative for back pain. Skin: Negative for rash. Neurological: Negative for headaches, focal weakness or numbness. Psych: Confusion  10-point ROS otherwise negative.  ____________________________________________   PHYSICAL EXAM:  VITAL SIGNS: ED Triage Vitals  Enc Vitals Group     BP 03/18/16 2240 101/60 mmHg     Pulse Rate 03/18/16 2240 75     Resp 03/18/16 2240 20     Temp 03/18/16 2240 98.3 F (36.8 C)     Temp Source 03/18/16 2240 Oral     SpO2 03/18/16 2240 92 %     Weight 03/18/16 2240 160 lb (72.576 kg)     Height 03/18/16 2240 _0  (1.956 m)     Head Cir --      Peak Flow --      Pain Score 03/18/16 2241 0     Pain Loc --      Pain Edu? --      Excl. in [place]? --     Constitutional: Alert and oriented to self with mild confusion. Well appearing and in Mild distress. Eyes: Conjunctivae are normal. PERRL. EOMI. Head: Atraumatic. Nose: No congestion/rhinnorhea. Mouth/Throat: Mucous membranes are moist.  Oropharynx non-erythematous.  Cardiovascular: Normal rate,  regular rhythm. Grossly normal heart sounds.  Good peripheral circulation. Respiratory: Normal respiratory effort.  No retractions. Lungs CTAB. Gastrointestinal: Soft and nontender. No distention. Positive bowel sounds Genitourinary: Normal external genitalia with Foley in place. Musculoskeletal: No lower extremity tenderness nor edema.   Neurologic:  Normal speech and language. Patient with some mild left upper extremity weakness, patient was some difficulty following commands. Cranial nerves II through XII are grossly intact patient with some mild right-sided facial droop. Skin:  Skin is warm, dry and intact.  Psychiatric: Mood and affect are normal.   ____________________________________________   LABS (all labs ordered are listed, but only abnormal results are displayed)  Labs Reviewed  URINALYSIS COMPLETEWITH MICROSCOPIC ([facility] ONLY) - Abnormal; Notable for the following:    Color, Urine YELLOW (*)    APPearance CLEAR (*)    Hgb urine dipstick 3+ (*)    Protein, ur 30 (*)    Bacteria, UA RARE (*)    All other components within normal limits  CBC - Abnormal; Notable for the following:    RBC 4.20 (*)    Hemoglobin 12.3 (*)    HCT 34.5 (*)    RDW 17.0 (*)    Platelets 137 (*)    All other components within normal limits  COMPREHENSIVE METABOLIC PANEL - Abnormal; Notable for the following:    Sodium 115 (*)    Chloride 86 (*)    CO2 17 (*)    Glucose, Bld 131 (*)    Calcium 8.8 (*)    Total Protein 6.1 (*)    ALT 14 (*)    Total Bilirubin 1.4 (*)    All other components within normal limits  TROPONIN I   ____________________________________________  EKG  none ____________________________________________  RADIOLOGY  Chest x-ray: Lower volumes with mild basilar atelectasis, emphysema, known mediastinal adenopathy.  CT head: No acute intracranial pathology seen on CT, mild cortical volume loss and scattered small vessel ischemic  microangiopathy. ____________________________________________   PROCEDURES  Procedure(s) performed: None  Critical Care performed: No  ____________________________________________   INITIAL IMPRESSION / ASSESSMENT AND PLAN / ED COURSE  Pertinent labs & imaging results that were available during my care of the patient were reviewed by me and considered in my medical decision making (see chart for details).  This is an 82 year old male who comes into the hospital today with some urinary retention as well as some confusion. The patient did have some blood work done yesterday but as he has some acute change we will repeat the patient's CBC, CMP and a troponin. We'll also check the patient's urinalysis which had 0-5 white blood cells and rare bacteria. He will also receive a head CT for further evaluation and a liter bolus fluid. I will go back to reassess the patient.  It appears that the patient has some hyponatremia with a sodium of 1:15. This may be contributing to the patient's confusion. He will be admitted to the hospitalist service for further evaluation. I did give the patient liter of normal saline. He has no further questions or concerns at this time. ____________________________________________   FINAL CLINICAL IMPRESSION(S) / ED DIAGNOSES  Final diagnoses:  Urinary retention  Hyponatremia  Disorientation      NEW MEDICATIONS STARTED DURING THIS VISIT:  New Prescriptions   No medications on file     Note:  This document was prepared using Dragon voice recognition software and may include unintentional dictation errors.    Loney Hering, MD 03/19/16  1572

## 2016-03-19 NOTE — Progress Notes (Signed)
[facility] NAME: Alexander Green    MRN#:  [id number]  DATE OF BIRTH:  1929/10/10  SUBJECTIVE:  Hospital Day: 0 days Alexander Green is a 82 y.o. male presenting with Urinary Retention .   Overnight events: Foley catheter placed without difficulty Interval Events: No current complaints patient remained somewhat confused, wife and daughter present at bedside  REVIEW OF SYSTEMS:  CONSTITUTIONAL: No fever, fatigue or weakness.  EYES: No blurred or double vision.  EARS, NOSE, AND THROAT: No tinnitus or ear pain.  RESPIRATORY: No cough, shortness of breath, wheezing or hemoptysis.  CARDIOVASCULAR: No chest pain, orthopnea, edema.  GASTROINTESTINAL: No nausea, vomiting, diarrhea or abdominal pain.  GENITOURINARY: No dysuria, hematuria.  ENDOCRINE: No polyuria, nocturia,  HEMATOLOGY: No anemia, easy bruising or bleeding SKIN: No rash or lesion. MUSCULOSKELETAL: No joint pain or arthritis.   NEUROLOGIC: No tingling, numbness, weakness.  PSYCHIATRY: No anxiety or depression.   DRUG ALLERGIES:   Allergies  Allergen Reactions  . Celecoxib Rash    VITALS:  Blood pressure 119/60, pulse 64, temperature 98.8 F (37.1 C), temperature source Oral, resp. rate 18, height 6\' 5"  (1.956 m), weight 160 lb (72.576 kg), SpO2 99 %.  PHYSICAL EXAMINATION:  VITAL SIGNS: Filed Vitals:   03/19/16 0444 03/19/16 1124  BP: 143/59 119/60  Pulse: 65 64  Temp: 98.8 F (37.1 C)   Resp: 18    GENERAL:82 y.o.male currently in no acute distress. Weak appearing HEAD: Normocephalic, atraumatic.  EYES: Pupils equal, round, reactive to light. Extraocular muscles intact. No scleral icterus.  MOUTH: Moist mucosal membrane. Dentition intact. No abscess noted.  EAR, NOSE, THROAT: Clear without exudates. No external lesions.  NECK: Supple. No thyromegaly. No nodules. No JVD.  PULMONARY: Clear to ascultation, without wheeze rails or rhonci. No use of  accessory muscles, Good respiratory effort. good air entry bilaterally CHEST: Nontender to palpation.  CARDIOVASCULAR: S1 and S2. Regular rate and rhythm. No murmurs, rubs, or gallops. No edema. Pedal pulses 2+ bilaterally.  GASTROINTESTINAL: Soft, nontender, nondistended. No masses. Positive bowel sounds. No hepatosplenomegaly.  MUSCULOSKELETAL: No swelling, clubbing, or edema. Range of motion full in all extremities.  NEUROLOGIC: Cranial nerves II through XII are intact. No gross focal neurological deficits. Sensation intact. Reflexes intact.  SKIN: No ulceration, lesions, rashes, or cyanosis. Skin warm and dry. Turgor poor.  PSYCHIATRIC: Mood, affect within normal limits. The patient is awake, alert and oriented x 3. Insight, judgment intact.      LABORATORY PANEL:   CBC  Recent Labs Lab 03/19/16 0029  WBC 8.6  HGB 12.3*  HCT 34.5*  PLT 137*   ------------------------------------------------------------------------------------------------------------------  Chemistries   Recent Labs Lab 03/19/16 0029  NA 115*  K 4.0  CL 86*  CO2 17*  GLUCOSE 131*  BUN 11  CREATININE 0.76  CALCIUM 8.8*  AST 33  ALT 14*  ALKPHOS 58  BILITOT 1.4*   ------------------------------------------------------------------------------------------------------------------  Cardiac Enzymes  Recent Labs Lab 03/19/16 0029  TROPONINI <0.03   ------------------------------------------------------------------------------------------------------------------  RADIOLOGY:  Dg Chest 2 View  03/18/2016  CLINICAL DATA:  Hypoxia. EXAM: CHEST  2 VIEW COMPARISON:  03/17/2016 FINDINGS: Emphysema with lower volumes and mild basilar atelectasis. There is no edema, consolidation, effusion, or pneumothorax. Normal heart size for technique. Known mediastinal adenopathy with visible enlarged right lower peritracheal node. IMPRESSION: 1. Lower volumes with mild basilar atelectasis. 2. Emphysema. 3. Known  mediastinal adenopathy. Electronically Signed   By: Neva Seat.D.  On: 03/18/2016 23:59   Ct Head Wo Contrast  03/19/2016  CLINICAL DATA:  Acute onset of altered mental status. Hypoxia. Initial encounter. EXAM: CT HEAD WITHOUT CONTRAST TECHNIQUE: Contiguous axial images were obtained from the base of the skull through the vertex without intravenous contrast. COMPARISON:  CT of the head performed 08/17/2010 FINDINGS: There is no evidence of acute infarction, mass lesion, or intra- or extra-axial hemorrhage on CT. Prominence of the ventricles and sulci reflects mild cortical volume loss. Mild cerebellar atrophy is noted. Scattered periventricular and subcortical white matter change likely reflects small vessel ischemic microangiopathy. The brainstem and fourth ventricle are within normal limits. The basal ganglia are unremarkable in appearance. The cerebral hemispheres demonstrate grossly normal gray-white differentiation. No mass effect or midline shift is seen. There is no evidence of fracture; visualized osseous structures are unremarkable in appearance. The orbits are within normal limits. The paranasal sinuses and mastoid air cells are well-aerated. No significant soft tissue abnormalities are seen. IMPRESSION: 1. No acute intracranial pathology seen on CT. 2. Mild cortical volume loss and scattered small vessel ischemic microangiopathy. Electronically Signed   By: Garald Balding M.D.   On: 03/19/2016 00:42   US Renal  03/19/2016  CLINICAL DATA:  82 year old male with urinary retention. History of prostate cancer. EXAM: RENAL / URINARY TRACT ULTRASOUND COMPLETE COMPARISON:  02/03/2016 CTs and prior studies. FINDINGS: Right Kidney: Length: 11 cm. Cortical atrophy identified with normal echogenicity. Multiple cysts are again noted, the largest measuring 5 x 4.7 x 3.7 cm. There is no evidence of solid mass or hydronephrosis. Left Kidney: Length: 13.3 cm. Cortical atrophy identified with normal  echogenicity. Multiple cysts are again identified, the largest measuring 2.8 cm. There is no evidence of solid mass hydronephrosis. Bladder: A Foley catheter is noted within the decompressed bladder. IMPRESSION: No evidence of hydronephrosis. Bilateral renal cortical atrophy and cysts. Foley catheter within a decompressed bladder. Electronically Signed   By: Margarette Canada M.D.   On: 03/19/2016 10:23    EKG:   Orders placed or performed during the hospital encounter of 03/17/16  . ED EKG within 10 minutes  . ED EKG within 10 minutes  . EKG    ASSESSMENT AND PLAN:   Audre Vandine is a 82 y.o. male presenting with Urinary Retention . Admitted 03/18/2016 : Day #: 0 days 1. Urinary retention in setting of prostate cancer: Foley catheter in place urology consult pending, start Flomax 2. Hyponatremia: Check TSH, continue gentle IV fluid hydration, recheck BMP from this morning after catheter placement continue to follow sodium level III. Prostate cancer: Hold oral cancer agents patient recently had issues with swallowing pills-instructed to stop this medicine by oncologist 4. Pill dysphagia: Speech evaluation 5. GERD without esophagitis PPI therapy   All the records are reviewed and case discussed with Care Management/Social Workerr. Management plans discussed with the patient, family and they are in agreement.  CODE STATUS: full TOTAL TIME TAKING CARE OF THIS PATIENT: 33 minutes.   POSSIBLE D/C IN 2-3DAYS, DEPENDING ON CLINICAL CONDITION.   Hower,  Karenann Cai.D on 03/19/2016 at 12:21 PM  Between 7am to 6pm - Pager - [phone]  After 6pm: House Pager: - [facility]  Office  [phone]  CC: Primary care physician; Otilio Miu, MD

## 2016-03-19 NOTE — Consult Note (Signed)
Consult: Urinary retention, prostate cancer, hyponatremia Requested by: Dr. Lavetta Nielsen  History of Present Illness: 82 year old with history of prostate cancer. He follows with Dr. Jacqlyn Larsen and Dr. Rogue Bussing. He has a history of nocturia and his wife noticed Friday night that he had constant urgency and incontinence. He was brought to hospital where he was found to be hyponatremic and over 1000 L PVR. A Foley catheter was placed. He is making good urine. Kidney function normal. Urine clear. It appears tamsulosin was started this hospital stay.  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 GERD (gastroesophageal reflux disease)   . Hypertension   . Hyperlipidemia   . B12 deficiency   . Stroke ([place])   . Neuromuscular disorder ([place])   . CAD (coronary artery disease)   . COPD (chronic obstructive pulmonary disease) ([place])   . Prostate cancer [facility]) 2009    Cryoablation with Dr. Jacqlyn Larsen   Past Surgical History  Procedure Laterality Date  . Back surgery    . Hernia repair    . Hip replacemnt (r)    . Knee surgery Right   . Colonoscopy  2010    Home Medications:  Facility-administered medications prior to admission  Medication Dose Route Frequency Provider Last Rate Last Dose  . cyanocobalamin ((VITAMIN B-12)) injection 1,000 mcg  1,000 mcg Intramuscular Once Juline Patch, MD       Prescriptions prior to admission  Medication Sig Dispense Refill Last Dose  . ALPRAZolam (XANAX) 0.5 MG tablet Take 0.5 mg by mouth at bedtime.    03/18/2016 at Unknown time  . aspirin 81 MG tablet Take 81 mg by mouth.   03/18/2016 at Unknown time  . Calcium Carbonate-Vitamin D  (CALCIUM 600+D) 600-400 MG-UNIT tablet Take 1 tablet by mouth daily.   03/18/2016 at Unknown time  . cetirizine (ZYRTEC) 10 MG tablet Take 10 mg by mouth daily.   03/18/2016 at Unknown time  . clopidogrel (PLAVIX) 75 MG tablet Take 75 mg by mouth.   03/18/2016 at Unknown time  . Cyanocobalamin 1000 MCG/ML KIT Inject 1 mL as directed every 30 (thirty) days.    Past Month at Unknown time  . ferrous sulfate 325 (65 FE) MG tablet Take 325 mg by mouth daily with breakfast.   03/18/2016 at Unknown time  . ipratropium (ATROVENT) 0.06 % nasal spray Place 2 sprays into both nostrils daily.    03/18/2016 at Unknown time  . Leuprolide Acetate, 6 Month, (LUPRON DEPOT) 45 MG injection Inject 45 mg as directed every 6 (six) months. Dr Jacqlyn Larsen   unknown at unknown  . metoprolol succinate (TOPROL-XL) 50 MG 24 hr tablet Take 50 mg by mouth daily.    03/18/2016 at 1600  . nitroGLYCERIN (NITRODUR - DOSED IN MG/24 HR) 0.1 mg/hr patch Place 0.1 mg onto the skin daily.    03/18/2016 at Unknown time  . pantoprazole (PROTONIX) 40 MG tablet Take 40 mg by mouth daily. Reported on 03/07/2016   03/18/2016 at Unknown time  . simvastatin (ZOCOR) 40 MG tablet Take 40 mg by mouth.   03/18/2016 at Unknown time  . TraMADol HCl 100 MG TB24 Take 100 mg by mouth daily as needed (pain).  prn at prn   Allergies:  Allergies  Allergen Reactions  . Celecoxib Rash    Family History  Problem Relation Age of Onset  . CAD Brother   . Stomach cancer Brother   . Prostate cancer Brother    Social History:  reports that he has never smoked. He does not have any smokeless tobacco history on file. He reports that he does not drink alcohol or use illicit drugs.  ROS: A complete review of systems was performed.  All systems are negative except for pertinent findings as noted. ROS   Physical Exam:  Vital signs in last 24 hours: Temp:  [98 F (36.7 C)-98.8 F (37.1 C)] 98 F (36.7 C) (06/04 1344) Pulse Rate:  [57-80] 65 (06/04 1345) Resp:  [18-22] 18  (06/04 1344) BP: (101-227)/(55-202) 118/55 mmHg (06/04 1345) SpO2:  [92 %-99 %] 97 % (06/04 1345) Weight:  [72.576 kg (160 lb)] 72.576 kg (160 lb) (06/03 2240) General:  Alert and oriented, No acute distress, elderly HEENT: Normocephalic, atraumatic Cardiovascular: Regular rate and rhythm Abdomen: Soft, nontender, nondistended, no abdominal masses Neurologic: Grossly intact GU: Foley in place, urine clear. About 1 L of urine in the bag.  Laboratory Data:  Results for orders placed or performed during the hospital encounter of 03/18/16 (from the past 24 hour(s))  Urinalysis complete, with microscopic- may I&O cath if menses     Status: Abnormal   Collection Time: 03/18/16 10:59 PM  Result Value Ref Range   Color, Urine YELLOW (A) YELLOW   APPearance CLEAR (A) CLEAR   Glucose, UA NEGATIVE NEGATIVE mg/dL   Bilirubin Urine NEGATIVE NEGATIVE   Ketones, ur NEGATIVE NEGATIVE mg/dL   Specific Gravity, Urine 1.008 1.005 - 1.030   Hgb urine dipstick 3+ (A) NEGATIVE   pH 7.0 5.0 - 8.0   Protein, ur 30 (A) NEGATIVE mg/dL   Nitrite NEGATIVE NEGATIVE   Leukocytes, UA NEGATIVE NEGATIVE   RBC / HPF TOO NUMEROUS TO COUNT 0 - 5 RBC/hpf   WBC, UA 0-5 0 - 5 WBC/hpf   Bacteria, UA RARE (A) NONE SEEN   Squamous Epithelial / LPF NONE SEEN NONE SEEN  Osmolality, urine     Status: Abnormal   Collection Time: 03/18/16 10:59 PM  Result Value Ref Range   Osmolality, Ur 254 (L) 300 - 900 mOsm/kg  CBC     Status: Abnormal   Collection Time: 03/19/16 12:29 AM  Result Value Ref Range   WBC 8.6 3.8 - 10.6 K/uL   RBC 4.20 (L) 4.40 - 5.90 MIL/uL   Hemoglobin 12.3 (L) 13.0 - 18.0 g/dL   HCT 34.5 (L) 40.0 - 52.0 %   MCV 82.0 80.0 - 100.0 fL   MCH 29.2 26.0 - 34.0 pg   MCHC 35.6 32.0 - 36.0 g/dL   RDW 17.0 (H) 11.5 - 14.5 %   Platelets 137 (L) 150 - 440 K/uL  Comprehensive metabolic panel     Status: Abnormal   Collection Time: 03/19/16 12:29 AM  Result Value Ref Range   Sodium 115 (LL) 135 - 145 mmol/L    Potassium 4.0 3.5 - 5.1 mmol/L   Chloride 86 (L) 101 - 111 mmol/L   CO2 17 (L) 22 - 32 mmol/L   Glucose, Bld 131 (H) 65 - 99 mg/dL   BUN 11 6 - 20 mg/dL   Creatinine, Ser 0.76 0.61 - 1.24 mg/dL   Calcium 8.8 (L) 8.9 - 10.3 mg/dL   Total Protein 6.1 (L) 6.5 -  8.1 g/dL   Albumin 4.0 3.5 - 5.0 g/dL   AST 33 15 - 41 U/L   ALT 14 (L) 17 - 63 U/L   Alkaline Phosphatase 58 38 - 126 U/L   Total Bilirubin 1.4 (H) 0.3 - 1.2 mg/dL   GFR calc non Af Amer >60 >60 mL/min   GFR calc Af Amer >60 >60 mL/min   Anion gap 12 5 - 15  Troponin I     Status: None   Collection Time: 03/19/16 12:29 AM  Result Value Ref Range   Troponin I <0.03 <0.031 ng/mL  Osmolality     Status: Abnormal   Collection Time: 03/19/16 12:29 AM  Result Value Ref Range   Osmolality 241 (LL) 275 - 295 mOsm/kg  TSH     Status: None   Collection Time: 03/19/16 12:29 AM  Result Value Ref Range   TSH 3.733 0.350 - 4.500 uIU/mL  Basic metabolic panel     Status: Abnormal   Collection Time: 03/19/16 12:06 PM  Result Value Ref Range   Sodium 123 (L) 135 - 145 mmol/L   Potassium 4.0 3.5 - 5.1 mmol/L   Chloride 94 (L) 101 - 111 mmol/L   CO2 22 22 - 32 mmol/L   Glucose, Bld 107 (H) 65 - 99 mg/dL   BUN 8 6 - 20 mg/dL   Creatinine, Ser 0.82 0.61 - 1.24 mg/dL   Calcium 8.6 (L) 8.9 - 10.3 mg/dL   GFR calc non Af Amer >60 >60 mL/min   GFR calc Af Amer >60 >60 mL/min   Anion gap 7 5 - 15   No results found for this or any previous visit (from the past 240 hour(s)). Creatinine:  Recent Labs  03/17/16 1051 03/19/16 0029 03/19/16 1206  CREATININE 0.75 0.76 0.82    Impression/Assessment/plan: Urinary retention-agree with tamsulosin. Because of the retention he'll need to keep the catheter on discharge. He should follow-up with Dr. Jacqlyn Larsen in as early as 5 days and as late as 3-4 weeks for catheter removal/void trial or Foley change.  Prostate cancer-per Dr. Jacqlyn Larsen and Dr. Rogue Bussing.   I will sign off, Please call on call doc  with any questions, concerns or changes in patient status.   Tanai Bouler 03/19/2016, 4:22 PM

## 2016-03-19 NOTE — ED Notes (Signed)
RN attempted to call report at this time, RN unavailable at this time due to giving meds. Will call this RN back momentarily.

## 2016-03-19 NOTE — Progress Notes (Signed)
Per Dr. Lavetta Nielsen okay to remove nitro patch by request of family as if he leaves it on until tomorrow it will cause headaches. This is how they administer his meds at home.

## 2016-03-19 NOTE — H&P (Signed)
[facility] [place] NAME: Alexander Green    MR#:  [id number]  DATE OF BIRTH:  [birth year]  DATE OF ADMISSION:  03/18/2016  PRIMARY CARE PHYSICIAN: Otilio Miu, MD   REQUESTING/REFERRING PHYSICIAN: Dahlia Client, MD  CHIEF COMPLAINT:   Chief Complaint  Patient presents with  . Urinary Retention    HISTORY OF PRESENT ILLNESS:  Alexander Green  is a 82 y.o. male who presents with 2 days urinary retention and progressive confusion. Patient was just seen in ED 24 hours for pill esophagitis which resolved spontaneously. Patient has a history of prostate cancer. On evaluation in the ED today he was found to have sodium of 115. His sodium yesterday was 120, it is unclear if he is having symptoms when he was seen here in the ED or not. Either way his family member at bedside states that his symptoms got significantly worse today, and he is lethargic and incoherent with his speech. Foley was placed in the ED with large-volume return of dilute bloody urine. Hospitalists were called for admission and further evaluation  PAST MEDICAL HISTORY:   Past Medical History  Diagnosis Date  . GERD (gastroesophageal reflux disease)   . Hypertension   . Hyperlipidemia   . B12 deficiency   . Stroke ([place])   . Neuromuscular disorder ([place])   . CAD (coronary artery disease)   . COPD (chronic obstructive pulmonary disease) ([place])   . Prostate cancer [facility]) 2009    Cryoablation with Dr. Jacqlyn Larsen    PAST SURGICAL HISTORY:   Past Surgical History  Procedure Laterality Date  . Back surgery    . Hernia repair    . Hip replacemnt (r)    . Knee surgery Right   . Colonoscopy  2010    SOCIAL HISTORY:   Social History  Substance Use Topics  . Smoking status: Never Smoker   . Smokeless tobacco: Not on file  . Alcohol Use: No    FAMILY HISTORY:   Family History  Problem Relation Age of Onset  . CAD Brother   . Stomach cancer Brother   . Prostate cancer Brother      DRUG ALLERGIES:   Allergies  Allergen Reactions  . Celecoxib Rash    MEDICATIONS AT HOME:   Prior to Admission medications   Medication Sig Start Date End Date Taking? Authorizing Provider  ALPRAZolam Duanne Moron) 0.5 MG tablet  02/07/16   Historical Provider, MD  aspirin 81 MG tablet Take 81 mg by mouth. 06/26/12   Historical Provider, MD  azelastine (ASTELIN) 0.1 % nasal spray Place into the nose. 12/09/14   Historical Provider, MD  cetirizine (ZYRTEC) 10 MG tablet Take 10 mg by mouth daily.    Historical Provider, MD  clopidogrel (PLAVIX) 75 MG tablet Take 75 mg by mouth. 06/26/12   Historical Provider, MD  Cyanocobalamin 1000 MCG/ML KIT Inject as directed.    Historical Provider, MD  enzalutamide [name]) 40 MG capsule Take 3 capsules (120 mg total) by mouth daily. 03/07/16   Cammie Sickle, MD  Hydrocodone-Acetaminophen 5-300 MG TABS Take by mouth. 10/26/14   Historical Provider, MD  ipratropium (ATROVENT) 0.06 % nasal spray Place 2 sprays into both nostrils 3 (three) times daily. Dr Tami Ribas    Historical Provider, MD  leuprolide (LUPRON) 11.25 MG KIT injection Inject into the muscle.    Historical Provider, MD  Leuprolide Acetate, 6 Month, (LUPRON DEPOT) 45 MG injection Inject 45 mg as directed every 6 (  six) months. Dr Jacqlyn Larsen 06/27/12   Historical Provider, MD  loratadine (CLARITIN) 10 MG tablet Take 10 mg by mouth.    Historical Provider, MD  metoprolol succinate (TOPROL-XL) 50 MG 24 hr tablet Take 50 mg by mouth. 06/26/12   Historical Provider, MD  mometasone (ELOCON) 0.1 % cream  11/01/15   Historical Provider, MD  nitroGLYCERIN (NITRODUR - DOSED IN MG/24 HR) 0.1 mg/hr patch  07/29/14   Historical Provider, MD  nystatin cream (MYCOSTATIN) Apply topically 2 (two) times daily. 03/16/16   Juline Patch, MD  pantoprazole (PROTONIX) 40 MG tablet Take 40 mg by mouth. Reported on 03/07/2016    Historical Provider, MD  simvastatin (ZOCOR) 40 MG tablet Take 40 mg by mouth. 06/26/12   Historical  Provider, MD  TraMADol HCl 100 MG TB24 Take 100 mg by mouth. 08/10/15   Historical Provider, MD    REVIEW OF SYSTEMS:  Review of Systems  Unable to perform ROS: mental acuity     VITAL SIGNS:   Filed Vitals:   03/18/16 2240 03/19/16 0032 03/19/16 0125  BP: 101/60 101/60 107/81  Pulse: 75 63 57  Temp: 98.3 F (36.8 C)    TempSrc: Oral    Resp: _0 Height: _1  (1.956 m)    Weight: 72.576 kg (160 lb)    SpO2: 92% 96% 96%   Wt Readings from Last 3 Encounters:  03/18/16 72.576 kg (160 lb)  03/17/16 72.576 kg (160 lb)  03/07/16 72.377 kg (159 lb 9 oz)    PHYSICAL EXAMINATION:  Physical Exam  Vitals reviewed. Constitutional: He appears well-developed and well-nourished. No distress.  HENT:  Head: Normocephalic and atraumatic.  Mouth/Throat: Oropharynx is clear and moist.  Eyes: Conjunctivae and EOM are normal. Pupils are equal, round, and reactive to light. No scleral icterus.  Neck: Normal range of motion. Neck supple. No JVD present. No thyromegaly present.  Cardiovascular: Normal rate, regular rhythm and intact distal pulses.  Exam reveals no gallop and no friction rub.   No murmur heard. Respiratory: Effort normal and breath sounds normal. No respiratory distress. He has no wheezes. He has no rales.  GI: Soft. Bowel sounds are normal. He exhibits no distension. There is no tenderness.  Musculoskeletal: Normal range of motion. He exhibits no edema.  No arthritis, no gout  Lymphadenopathy:    He has no cervical adenopathy.  Neurological:  Unable to fully assess due to pt condition  Skin: Skin is warm and dry. No rash noted. No erythema.  Psychiatric:  Unable to fully assess due to pt condition     LABORATORY PANEL:   CBC  Recent Labs Lab 03/19/16 0029  WBC 8.6  HGB 12.3*  HCT 34.5*  PLT 137*   ------------------------------------------------------------------------------------------------------------------  Chemistries   Recent Labs Lab  03/19/16 0029  NA 115*  K 4.0  CL 86*  CO2 17*  GLUCOSE 131*  BUN 11  CREATININE 0.76  CALCIUM 8.8*  AST 33  ALT 14*  ALKPHOS 58  BILITOT 1.4*   ------------------------------------------------------------------------------------------------------------------  Cardiac Enzymes  Recent Labs Lab 03/19/16 0029  TROPONINI <0.03   ------------------------------------------------------------------------------------------------------------------  RADIOLOGY:  Dg Chest 2 View  03/18/2016  CLINICAL DATA:  Hypoxia. EXAM: CHEST  2 VIEW COMPARISON:  03/17/2016 FINDINGS: Emphysema with lower volumes and mild basilar atelectasis. There is no edema, consolidation, effusion, or pneumothorax. Normal heart size for technique. Known mediastinal adenopathy with visible enlarged right lower peritracheal node. IMPRESSION: 1. Lower volumes with mild basilar atelectasis. 2.  Emphysema. 3. Known mediastinal adenopathy. Electronically Signed   By: Monte Fantasia M.D.   On: 03/18/2016 23:59   Dg Chest 2 View  03/17/2016  CLINICAL DATA:  82 year old male with difficulty swallowing, feels like pills he took last night are stuck in his throat and chest. Epigastric pain and burning. Initial encounter. EXAM: CHEST  2 VIEW COMPARISON:  Chest abdomen and pelvis CT 02/03/2016 and earlier FINDINGS: Seated AP and lateral views of the chest. Calcified aortic atherosclerosis. Mediastinal contours are stable since 2015 and within normal limits. Visualized tracheal air column is within normal limits. No gas is evident within the esophagus. No esophageal dilatation is evident. No radiopaque foreign body identified. Stable lung volumes. No pneumothorax, pulmonary edema, pleural effusion or confluent pulmonary opacity. Negative visible bowel gas pattern, no pneumoperitoneum identified. Osteopenia. No acute osseous abnormality identified. IMPRESSION: No acute cardiopulmonary abnormality. Electronically Signed   By: Genevie Ann M.D.   On:  03/17/2016 11:41   Ct Head Wo Contrast  03/19/2016  CLINICAL DATA:  Acute onset of altered mental status. Hypoxia. Initial encounter. EXAM: CT HEAD WITHOUT CONTRAST TECHNIQUE: Contiguous axial images were obtained from the base of the skull through the vertex without intravenous contrast. COMPARISON:  CT of the head performed 08/17/2010 FINDINGS: There is no evidence of acute infarction, mass lesion, or intra- or extra-axial hemorrhage on CT. Prominence of the ventricles and sulci reflects mild cortical volume loss. Mild cerebellar atrophy is noted. Scattered periventricular and subcortical white matter change likely reflects small vessel ischemic microangiopathy. The brainstem and fourth ventricle are within normal limits. The basal ganglia are unremarkable in appearance. The cerebral hemispheres demonstrate grossly normal gray-white differentiation. No mass effect or midline shift is seen. There is no evidence of fracture; visualized osseous structures are unremarkable in appearance. The orbits are within normal limits. The paranasal sinuses and mastoid air cells are well-aerated. No significant soft tissue abnormalities are seen. IMPRESSION: 1. No acute intracranial pathology seen on CT. 2. Mild cortical volume loss and scattered small vessel ischemic microangiopathy. Electronically Signed   By: Garald Balding M.D.   On: 03/19/2016 00:42    EKG:   Orders placed or performed during the hospital encounter of 03/17/16  . ED EKG within 10 minutes  . ED EKG within 10 minutes  . EKG    IMPRESSION AND PLAN:  Principal Problem:   Urinary retention - unclear etiology for this at this time, though there is a high suspicion is related to his prostate cancer. Foley was placed successfully in the ED which bypass obstruction with good urine output. His urine was bloody. We'll get a urology consult and a renal ultrasound. Active Problems:   Hyponatremia - suspect this is likely related to his obstruction and  urinary retention, workup for the same above. However, we'll also send serum and urine osmolality   HTN (hypertension) - currently with borderline low blood pressure, hold home antihypertensives for now, IV fluids for support.   COPD (chronic obstructive pulmonary disease) (HCC) - continue home inhalers   CAD (coronary artery disease) - continue home meds   GERD (gastroesophageal reflux disease) - home dose PPI   HLD (hyperlipidemia) - continue home meds  All the records are reviewed and case discussed with ED provider. Management plans discussed with the patient and/or family.  DVT PROPHYLAXIS: SubQ lovenox  GI PROPHYLAXIS: PPI  ADMISSION STATUS: Inpatient  CODE STATUS: Full Code Status History    This patient does not have a recorded code status.  Please follow your organizational policy for patients in this situation.      TOTAL TIME TAKING CARE OF THIS PATIENT: 45 minutes.    Raelene Trew [place] 03/19/2016, 1:44 AM  [facility]  Office  [phone]  CC: Primary care physician; Otilio Miu, MD

## 2016-03-20 DIAGNOSIS — E440 Moderate protein-calorie malnutrition: Secondary | ICD-10-CM | POA: Insufficient documentation

## 2016-03-20 LAB — BASIC METABOLIC PANEL
ANION GAP: 9 (ref 5–15)
BUN: 8 mg/dL (ref 6–20)
CO2: 19 mmol/L — AB (ref 22–32)
Calcium: 8.8 mg/dL — ABNORMAL LOW (ref 8.9–10.3)
Chloride: 101 mmol/L (ref 101–111)
Creatinine, Ser: 0.79 mg/dL (ref 0.61–1.24)
GFR calc non Af Amer: >60 mL/min (ref >60)
GLUCOSE: 99 mg/dL (ref 65–99)
POTASSIUM: 3.4 mmol/L — AB (ref 3.5–5.1)
Sodium: 129 mmol/L — ABNORMAL LOW (ref 135–145)

## 2016-03-20 MED ORDER — SODIUM CHLORIDE 0.9 % IV SOLN
INTRAVENOUS | Status: AC
  Administered 2016-03-20: 14:00:00 via INTRAVENOUS

## 2016-03-20 MED ORDER — PANTOPRAZOLE SODIUM 40 MG PO PACK
40.0000 mg | PACK | Freq: Every day | ORAL | Status: DC
  Administered 2016-03-20 – 2016-03-22 (×3): 40 mg
  Filled 2016-03-20 (×3): qty 20

## 2016-03-20 MED ORDER — ENSURE ENLIVE PO LIQD: 237.0000 mL | Freq: Two times a day (BID) | ORAL | Status: DC

## 2016-03-20 MED ORDER — ENSURE ENLIVE PO LIQD
237.0000 mL | Freq: Two times a day (BID) | ORAL | Status: DC
  Administered 2016-03-20 – 2016-03-23 (×4): 237 mL via ORAL

## 2016-03-20 MED ORDER — HALOPERIDOL LACTATE 5 MG/ML IJ SOLN
INTRAMUSCULAR | Status: AC
  Filled 2016-03-20: qty 1

## 2016-03-20 NOTE — Evaluation (Signed)
Clinical/Bedside Swallow Evaluation Patient Details  Name: Alexander Green MRN: [id number] Date of Birth: 1928-11-16  Today's Date: 03/20/2016 Time: SLP Start Time (ACUTE ONLY): 71 SLP Stop Time (ACUTE ONLY): 1130 (1130) SLP Time Calculation (min) (ACUTE ONLY): 60 min  Past Medical History:  Past Medical History  Diagnosis Date  . GERD (gastroesophageal reflux disease)   . Hypertension   . Hyperlipidemia   . B12 deficiency   . Stroke ([place])   . Neuromuscular disorder ([place])   . CAD (coronary artery disease)   . COPD (chronic obstructive pulmonary disease) ([place])   . Prostate cancer [facility]) 2009    Cryoablation with Dr. Jacqlyn Larsen   Past Surgical History:  Past Surgical History  Procedure Laterality Date  . Back surgery    . Hernia repair    . Hip replacemnt (r)    . Knee surgery Right   . Colonoscopy  2010   HPI:  Alexander Green is a 82 y.o. male who presents with 2 days urinary retention and progressive confusion. Patient was just seen in ED 24 hours for pill esophagitis which resolved spontaneously. Patient has a history of prostate cancer. On evaluation in the ED today he was found to have sodium of 115. His sodium yesterday was 120, it is unclear if he is having symptoms when he was seen here in the ED or not. Either way his family member at bedside states that his symptoms got significantly worse today, and he is lethargic and incoherent with his speech. Foley was placed in the ED with large-volume return of dilute bloody urine. Pt with presentation to ED on March 17, 2016 with report of chemotherapy pill getting stuck in his throat. Alexander Green is a 82 y.o. male was recently started on xTandi medication. He states that the very large polys having trouble swallowing it, yesterday folic he got stuck but then went down again this morning. At this time he has no complaints. He states he has a history of acid indigestion 80s taking antacids. He has a history of "burping" and  sometimes he burps. However the reason he is here today is that when he swallowed his pills this morning and last night felt like it got "hung up" for a few seconds. He is taking them with water. They show me a pill that he is supposed be taking, it is very large. He has been having trouble with it since they started. At the time of discharge on 03/17/2016, the patient didn't have any complaints was even able to drink with no difficulty. He has had no abdominal pain and he has no diarrhea. No chest pain or shortness of breath. He does not feel that the pill is still struck. Pt was to follow-up with GI but was reattempted on 03/19/2016 secondary to urinary retention.    Assessment / Plan / Recommendation Clinical Impression  Pt presented with functional oral phase as evidenced by effective bolus manipulation and complete oral clearing of puree and dysphagia 2 trials. Pt with timely swallow initiation with trial of thin liquids. However, with each trial of thin liquids (via cup, spoon or straw) pt with consistent large belch followed by throat clears. Belching greatly reduced with trials of puree and dysphagia 2 textures. Honey thick liquid trials administered to assess weighted bolus. Belching still present but greatly reduced with honey thick trials. Pt unable to consume pills whole in applesauce but was able to consume when crushed in applesauce. Pt's wife states that pt has been  belching for more than 6 months and takes Mylanta which helps "some." Pt was to have follow up appointment with Dr. Vira Agar but was readmitted to hospital before appointment could be made. Pt would benefit from further GI follow up and barium swallow study to assess esophegeal function. MD aware, agreeable and will order. Given pt's recent difficulty swallowing pills and his recent confusion, ST recommends conservative diet of dysphagia 2, thin liquids and medicine crushed in puree. Will follow up after barium study has been completed for  any further directions or changeds.     Aspiration Risk  Mild aspiration risk    Diet Recommendation Dysphagia 2, thin liquids  Medication Administration: Crushed with puree    Other  Recommendations Recommended Consults: Consider GI evaluation;Consider esophageal assessment Oral Care Recommendations: Oral care BID Other Recommendations:  (Barium Swallow Study)              Swallow Study   General Date of Onset: 03/18/16 (However pt seen at ) HPI: Alexander Green is a 82 y.o. male who presents with 2 days urinary retention and progressive confusion. Patient was just seen in ED 24 hours for pill esophagitis which resolved spontaneously. Patient has a history of prostate cancer. On evaluation in the ED today he was found to have sodium of 115. His sodium yesterday was 120, it is unclear if he is having symptoms when he was seen here in the ED or not. Either way his family member at bedside states that his symptoms got significantly worse today, and he is lethargic and incoherent with his speech. Foley was placed in the ED with large-volume return of dilute bloody urine. Pt with presentation to ED on March 17, 2016 with report of chemotherapy pill getting stuck in his throat. Alexander Green is a 82 y.o. male was recently started on xTandi medication. He states that the very large polys having trouble swallowing it, yesterday folic he got stuck but then went down again this morning. At this time he has no complaints. He states he has a history of acid indigestion 80s taking antacids. He has a history of "burping" and sometimes he burps. However the reason he is here today is that when he swallowed his pills this morning and last night felt like it got "hung up" for a few seconds. He is taking them with water. They show me a pill that he is supposed be taking, it is very large. He has been having trouble with it since they started. At the time of discharge on 03/17/2016, the patient didn't have any  complaints was even able to drink with no difficulty. He has had no abdominal pain and he has no diarrhea. No chest pain or shortness of breath. He does not feel that the pill is still struck. Pt was to follow-up with GI but was reattempted on 03/19/2016 secondary to urinary retention.  Type of Study: Bedside Swallow Evaluation Diet Prior to this Study: Regular;Thin liquids Temperature Spikes Noted: No Respiratory Status: Room air History of Recent Intubation: No Behavior/Cognition: Alert;Cooperative;Requires cueing;Distractible;Confused Oral Cavity Assessment: Within Functional Limits Oral Care Completed by SLP: No Oral Cavity - Dentition: Poor condition;Missing dentition Vision: Functional for self-feeding Self-Feeding Abilities: Able to feed self;Needs assist (Pt with tremors and may need help with hand to mouth. ) Patient Positioning: Upright in bed Baseline Vocal Quality: Low vocal intensity Volitional Cough: Strong Volitional Swallow: Able to elicit    Oral/Motor/Sensory Function Overall Oral Motor/Sensory Function: Within functional limits   Ice  Chips Ice chips: Not tested   Thin Liquid Thin Liquid: Within functional limits Presentation: Cup;Spoon;Straw;Self Fed (no overt s/s of aspiration but consistent belching present)    Nectar Thick Nectar Thick Liquid: Not tested   Honey Thick Honey Thick Liquid: Within functional limits Presentation: Spoon (SLP fed 6 bolus, no belching present with heavier bolus)   Puree Puree: Within functional limits Presentation: Spoon (SLP fed, no belching present)   Solid   GO   Solid: Within functional limits Presentation: Spoon (SLP fed, 6 bolus graham crackers in applesauce, no belching)        Coleson Kant 03/20/2016,11:25 AM

## 2016-03-20 NOTE — Progress Notes (Signed)
Alexander Green NAME: Alexander Green    MRN#:  [id number]  DATE OF BIRTH:  [birth year]  SUBJECTIVE:  Hospital Day: 1 day Alexander Green is a 82 y.o. male presenting with Urinary Retention .   Overnight events: Increased confusion Interval Events: No current complaints patient remained somewhat confused, wife  present at bedside  REVIEW OF SYSTEMS:  Unable to obtain given patient's mental status  DRUG ALLERGIES:   Allergies  Allergen Reactions  . Celecoxib Rash    VITALS:  Blood pressure 110/68, pulse 70, temperature 98.2 F (36.8 C), temperature source Oral, resp. rate 19, height 6\' 5"  (1.956 m), weight 160 lb (72.576 kg), SpO2 99 %.  PHYSICAL EXAMINATION:   VITAL SIGNS: Filed Vitals:   03/20/16 0454 03/20/16 1140  BP: 137/74 110/68  Pulse: 82 70  Temp:  98.2 F (36.8 C)  Resp: 18 19   GENERAL:82 y.o.male moderate distress given mental status.  HEAD: Normocephalic, atraumatic.  EYES: Pupils equal, round, reactive to light. Unable to assess extraocular muscles given mental status/medical condition. No scleral icterus.  MOUTH: Moist mucosal membrane. Dentition intact. No abscess noted.  EAR, NOSE, THROAT: Clear without exudates. No external lesions.  NECK: Supple. No thyromegaly. No nodules. No JVD.  PULMONARY: Clear to ascultation, without wheeze rails or rhonci. No use of accessory muscles, Good respiratory effort. good air entry bilaterally CHEST: Nontender to palpation.  CARDIOVASCULAR: S1 and S2. Regular rate and rhythm. No murmurs, rubs, or gallops. No edema. Pedal pulses 2+ bilaterally.  GASTROINTESTINAL: Soft, nontender, nondistended. No masses. Positive bowel sounds. No hepatosplenomegaly.  MUSCULOSKELETAL: No swelling, clubbing, or edema. Range of motion full in all extremities.  NEUROLOGIC: Unable to assess given mental status/medical condition SKIN: No ulceration, lesions, rashes, or cyanosis. Skin warm and  dry. Turgor intact.  PSYCHIATRIC: Unable to assess given mental status/medical condition        LABORATORY PANEL:   CBC  Recent Labs Lab 03/19/16 0029  WBC 8.6  HGB 12.3*  HCT 34.5*  PLT 137*   ------------------------------------------------------------------------------------------------------------------  Chemistries   Recent Labs Lab 03/19/16 0029  03/20/16 0533  NA 115*  < > 129*  K 4.0  < > 3.4*  CL 86*  < > 101  CO2 17*  < > 19*  GLUCOSE 131*  < > 99  BUN 11  < > 8  CREATININE 0.76  < > 0.79  CALCIUM 8.8*  < > 8.8*  AST 33  --   --   ALT 14*  --   --   ALKPHOS 58  --   --   BILITOT 1.4*  --   --   < > = values in this interval not displayed. ------------------------------------------------------------------------------------------------------------------  Cardiac Enzymes  Recent Labs Lab 03/19/16 0029  TROPONINI <0.03   ------------------------------------------------------------------------------------------------------------------  RADIOLOGY:  Dg Chest 2 View  03/18/2016  CLINICAL DATA:  Hypoxia. EXAM: CHEST  2 VIEW COMPARISON:  03/17/2016 FINDINGS: Emphysema with lower volumes and mild basilar atelectasis. There is no edema, consolidation, effusion, or pneumothorax. Normal heart size for technique. Known mediastinal adenopathy with visible enlarged right lower peritracheal node. IMPRESSION: 1. Lower volumes with mild basilar atelectasis. 2. Emphysema. 3. Known mediastinal adenopathy. Electronically Signed   By: Monte Fantasia M.D.   On: 03/18/2016 23:59   Ct Head Wo Contrast  03/19/2016  CLINICAL DATA:  Acute onset of altered mental status. Hypoxia. Initial encounter. EXAM: CT HEAD WITHOUT CONTRAST TECHNIQUE: Contiguous axial images  were obtained from the base of the skull through the vertex without intravenous contrast. COMPARISON:  CT of the head performed 08/17/2010 FINDINGS: There is no evidence of acute infarction, mass lesion, or intra- or  extra-axial hemorrhage on CT. Prominence of the ventricles and sulci reflects mild cortical volume loss. Mild cerebellar atrophy is noted. Scattered periventricular and subcortical white matter change likely reflects small vessel ischemic microangiopathy. The brainstem and fourth ventricle are within normal limits. The basal ganglia are unremarkable in appearance. The cerebral hemispheres demonstrate grossly normal gray-white differentiation. No mass effect or midline shift is seen. There is no evidence of fracture; visualized osseous structures are unremarkable in appearance. The orbits are within normal limits. The paranasal sinuses and mastoid air cells are well-aerated. No significant soft tissue abnormalities are seen. IMPRESSION: 1. No acute intracranial pathology seen on CT. 2. Mild cortical volume loss and scattered small vessel ischemic microangiopathy. Electronically Signed   By: Garald Balding M.D.   On: 03/19/2016 00:42   US Renal  03/19/2016  CLINICAL DATA:  82 year old male with urinary retention. History of prostate cancer. EXAM: RENAL / URINARY TRACT ULTRASOUND COMPLETE COMPARISON:  02/03/2016 CTs and prior studies. FINDINGS: Right Kidney: Length: 11 cm. Cortical atrophy identified with normal echogenicity. Multiple cysts are again noted, the largest measuring 5 x 4.7 x 3.7 cm. There is no evidence of solid mass or hydronephrosis. Left Kidney: Length: 13.3 cm. Cortical atrophy identified with normal echogenicity. Multiple cysts are again identified, the largest measuring 2.8 cm. There is no evidence of solid mass hydronephrosis. Bladder: A Foley catheter is noted within the decompressed bladder. IMPRESSION: No evidence of hydronephrosis. Bilateral renal cortical atrophy and cysts. Foley catheter within a decompressed bladder. Electronically Signed   By: Margarette Canada M.D.   On: 03/19/2016 10:23    EKG:   Orders placed or performed during the hospital encounter of 03/17/16  . ED EKG within 10  minutes  . ED EKG within 10 minutes  . EKG    ASSESSMENT AND PLAN:   Alexander Green is a 82 y.o. male presenting with Urinary Retention . Admitted 03/18/2016 : Day #: 1 day 1. Urinary retention in setting of prostate cancer: Foley catheter in place urology Input appreciated continue Flomax 2. Hyponatremia: Continue hydration improving sodium levels III. Prostate cancer: Hold oral cancer agents patient recently had issues with swallowing pills-instructed to stop this medicine by oncologist 4. Dysphagia: Esophageal barium study tomorrow 5. GERD without esophagitis PPI therapy   All the records are reviewed and case discussed with Care Management/Social Workerr. Management plans discussed with the patient, family and they are in agreement.  CODE STATUS: full TOTAL TIME TAKING CARE OF THIS PATIENT: 33 minutes.   POSSIBLE D/C IN 2-3DAYS, DEPENDING ON CLINICAL CONDITION.   Vangie Henthorn,  Karenann Cai.D on 03/20/2016 at 2:55 PM  Between 7am to 6pm - Pager - [phone]  After 6pm: House Pager: - [facility]  Office  [phone]  CC: Primary care physician; Otilio Miu, MD

## 2016-03-20 NOTE — Progress Notes (Signed)
Initial Nutrition Assessment  DOCUMENTATION CODES:   Non-severe (moderate) malnutrition in context of chronic illness  INTERVENTION:  -Monitor intake and cater to pt preferences within diet restrictions -Recommend Ensure Enlive po BID, each supplement provides 350 kcal and 20 grams of protein    NUTRITION DIAGNOSIS:   Malnutrition related to cancer and cancer related treatments, chronic illness as evidenced by moderate depletion of body fat, moderate depletions of muscle mass.    GOAL:   Patient will meet greater than or equal to 90% of their needs    MONITOR:   PO intake, Supplement acceptance  REASON FOR ASSESSMENT:   Malnutrition Screening Tool    ASSESSMENT:     Pt admitted with urinary retention and hyponatremia. Noted recent pill esophagitis.  Planning GI evaulation  Past Medical History  Diagnosis Date  . GERD (gastroesophageal reflux disease)   . Hypertension   . Hyperlipidemia   . B12 deficiency   . Stroke ([place])   . Neuromuscular disorder ([place])   . CAD (coronary artery disease)   . COPD (chronic obstructive pulmonary disease) ([place])   . Prostate cancer [facility]) 2009    Cryoablation with Dr. Jacqlyn Larsen   Pt unable to give history, wife at bedside and reports pt with poor po intake for " awhile"  Unable to give time frame. Ate 1/2 egg and coffee this am for breakfast.  SLP evaluated  Medications reviewed:NS at 164ml/hr Labs reviewed: Na 129, K 3.4  Nutrition-Focused physical exam completed. Findings are moderate fat depletion, moderate to severe muscle depletion, and mild edema.  Wife reports pt walks with walker typically.    Diet Order:  DIET DYS 2 Room service appropriate?: Yes; Fluid consistency:: Thin  Skin:  Reviewed, no issues  Last BM:  PTA  Height:   Ht Readings from Last 1 Encounters:  03/18/16 6\' 5"  (1.956 m)    Weight: 2% wt loss in the last 3 months  Wife reports 10 pound weight loss in the last year (6% wt loss in 1 year)  Wt  Readings from Last 1 Encounters:  03/18/16 160 lb (72.576 kg)    Ideal Body Weight:     BMI:  Body mass index is 18.97 kg/(m^2).  Estimated Nutritional Needs:   Kcal:  2160-2520 kcals/d  Protein:  108-126 g/d  Fluid:  >/= 2 L/d  EDUCATION NEEDS:   No education needs identified at this time  Jiovanny Burdell B. Zenia Resides, [place], [place] (pager) Weekend/On-Call pager [phone])

## 2016-03-21 ENCOUNTER — Inpatient Hospital Stay: Payer: Medicare Other

## 2016-03-21 LAB — CBC
HCT: 31.8 % — ABNORMAL LOW (ref 40.0–52.0)
Hemoglobin: 11.3 g/dL — ABNORMAL LOW (ref 13.0–18.0)
MCH: 29.3 pg (ref 26.0–34.0)
MCHC: 35.4 g/dL (ref 32.0–36.0)
MCV: 82.7 fL (ref 80.0–100.0)
PLATELETS: 113 10*3/uL — AB (ref 150–440)
RBC: 3.84 MIL/uL — ABNORMAL LOW (ref 4.40–5.90)
RDW: 16.6 % — AB (ref 11.5–14.5)
WBC: 5.4 10*3/uL (ref 3.8–10.6)

## 2016-03-21 LAB — BASIC METABOLIC PANEL
ANION GAP: 9 (ref 5–15)
ANION GAP: 8 (ref 5–15)
BUN: 10 mg/dL (ref 6–20)
BUN: 9 mg/dL (ref 6–20)
CALCIUM: 8.4 mg/dL — AB (ref 8.9–10.3)
CALCIUM: 8.2 mg/dL — AB (ref 8.9–10.3)
CO2: 20 mmol/L — AB (ref 22–32)
CO2: 21 mmol/L — ABNORMAL LOW (ref 22–32)
CREATININE: 0.5 mg/dL — AB (ref 0.61–1.24)
Chloride: 96 mmol/L — ABNORMAL LOW (ref 101–111)
Chloride: 96 mmol/L — ABNORMAL LOW (ref 101–111)
Creatinine, Ser: 0.56 mg/dL — ABNORMAL LOW (ref 0.61–1.24)
GFR calc Af Amer: >60 mL/min (ref >60)
GFR calc Af Amer: >60 mL/min (ref >60)
GFR calc non Af Amer: >60 mL/min (ref >60)
GLUCOSE: 109 mg/dL — AB (ref 65–99)
Glucose, Bld: 110 mg/dL — ABNORMAL HIGH (ref 65–99)
POTASSIUM: 3.3 mmol/L — AB (ref 3.5–5.1)
Potassium: 3.4 mmol/L — ABNORMAL LOW (ref 3.5–5.1)
SODIUM: 125 mmol/L — AB (ref 135–145)
Sodium: 125 mmol/L — ABNORMAL LOW (ref 135–145)

## 2016-03-21 MED ORDER — POTASSIUM CHLORIDE 10 MEQ/100ML IV SOLN
10.0000 meq | INTRAVENOUS | Status: AC
  Administered 2016-03-21 (×2): 10 meq via INTRAVENOUS
  Filled 2016-03-21 (×2): qty 100

## 2016-03-21 MED ORDER — SODIUM CHLORIDE 0.9 % IV SOLN: INTRAVENOUS | Status: DC

## 2016-03-21 MED ORDER — ALPRAZOLAM 0.5 MG PO TABS
0.5000 mg | ORAL_TABLET | Freq: Every day | ORAL | Status: DC
  Administered 2016-03-21: 0.5 mg via ORAL
  Filled 2016-03-21: qty 1

## 2016-03-21 MED ORDER — SODIUM CHLORIDE 0.9 % IV SOLN
INTRAVENOUS | Status: DC
  Administered 2016-03-21 – 2016-03-23 (×4): via INTRAVENOUS

## 2016-03-21 NOTE — Progress Notes (Signed)
Removed Nitro patch at [phone] per family request as it causes headaches if he ;eaves it on too long.

## 2016-03-21 NOTE — Care Management Important Message (Signed)
Important Message  Patient Details  Name: Alexander Green MRN: [id number] Date of Birth: 1928-10-26   Medicare Important Message Given:  Yes    Beverly Sessions, RN 03/21/2016, 12:14 PM

## 2016-03-21 NOTE — Plan of Care (Signed)
Problem: Education: Goal: Knowledge of [facility] Education information/materials will improve Outcome: Progressing Sodium level increasing, 129. Swallow eval scheduled for in the morning.   Problem: Safety: Goal: Ability to remain free from injury will improve Outcome: Progressing Pt restless at times, bed alarm set. PRN haldol available, HS xanax ordered. Pt reoriented to room and equipment, family went home over night.   Problem: Physical Regulation: Goal: Ability to maintain clinical measurements within normal limits will improve Outcome: Not Progressing Pt restricted to bedrest at present time, daughter stated pt uses a walker at home with foot drop boots.  Goal: Will remain free from infection Outcome: Progressing Will cont to monitor WBC, pt afebrile.   Problem: Skin Integrity: Goal: Risk for impaired skin integrity will decrease Outcome: Progressing Pt with skin tear noted to right arm, wife stated it was present PTA.   Problem: Tissue Perfusion: Goal: Risk factors for ineffective tissue perfusion will decrease Outcome: Progressing Pt with Left lower leg/ankle edema, wife stated nothing new.   Problem: Nutrition: Goal: Adequate nutrition will be maintained Outcome: Not Progressing Pt npo past mn for swallow study in am.

## 2016-03-21 NOTE — Progress Notes (Signed)
[place] [facility] [place] NAME: Alexander Green    MRN#:  [id number]  DATE OF BIRTH:  [birth year]  SUBJECTIVE:  Hospital Day: 2 days Council Pencek is a 82 y.o. male presenting with Urinary Retention .   Overnight events: Received Haldol overnight for agitation Interval Events: Confused/somnolent, wife  present at bedside  REVIEW OF SYSTEMS:  Unable to obtain given patient's mental status  DRUG ALLERGIES:   Allergies  Allergen Reactions  . Celecoxib Rash    VITALS:  Blood pressure 135/86, pulse 75, temperature 98.6 F (37 C), temperature source Axillary, resp. rate 18, height 6\' 5"  (1.956 m), weight 160 lb (72.576 kg), SpO2 97 %.  PHYSICAL EXAMINATION:   VITAL SIGNS: Filed Vitals:   03/21/16 1102 03/21/16 1222  BP: 150/72 135/86  Pulse: 78 75  Temp:  98.6 F (37 C)  Resp:  45   GENERAL:82 y.o.male moderate distress given mental status.  HEAD: Normocephalic, atraumatic.  EYES: Pupils equal, round, reactive to light. Unable to assess extraocular muscles given mental status/medical condition. No scleral icterus.  MOUTH: Moist mucosal membrane. Dentition intact. No abscess noted.  EAR, NOSE, THROAT: Clear without exudates. No external lesions.  NECK: Supple. No thyromegaly. No nodules. No JVD.  PULMONARY: Clear to ascultation, without wheeze rails or rhonci. No use of accessory muscles, Good respiratory effort. good air entry bilaterally CHEST: Nontender to palpation.  CARDIOVASCULAR: S1 and S2. Regular rate and rhythm. No murmurs, rubs, or gallops. No edema. Pedal pulses 2+ bilaterally.  GASTROINTESTINAL: Soft, nontender, nondistended. No masses. Positive bowel sounds. No hepatosplenomegaly.  MUSCULOSKELETAL: No swelling, clubbing, or edema. Range of motion full in all extremities.  NEUROLOGIC: Unable to assess given mental status/medical condition SKIN: No ulceration, lesions, rashes, or cyanosis. Skin warm and dry. Turgor  intact.  PSYCHIATRIC: Unable to assess given mental status/medical condition        LABORATORY PANEL:   CBC  Recent Labs Lab 03/21/16 0446  WBC 5.4  HGB 11.3*  HCT 31.8*  PLT 113*   ------------------------------------------------------------------------------------------------------------------  Chemistries   Recent Labs Lab 03/19/16 0029  03/21/16 0446  NA 115*  < > 125*  K 4.0  < > 3.4*  CL 86*  < > 96*  CO2 17*  < > 20*  GLUCOSE 131*  < > 109*  BUN 11  < > 10  CREATININE 0.76  < > 0.50*  CALCIUM 8.8*  < > 8.4*  AST 33  --   --   ALT 14*  --   --   ALKPHOS 58  --   --   BILITOT 1.4*  --   --   < > = values in this interval not displayed. ------------------------------------------------------------------------------------------------------------------  Cardiac Enzymes  Recent Labs Lab 03/19/16 0029  TROPONINI <0.03   ------------------------------------------------------------------------------------------------------------------  RADIOLOGY:  Dg Esophagus  03/21/2016  CLINICAL DATA:  Dysphagia EXAM: ESOPHOGRAM/BARIUM SWALLOW TECHNIQUE: Single contrast examination was attempted using thin barium or water soluble. FLUOROSCOPY TIME:  Fluoroscopy Time:  0 minutes, 18 seconds Number of Acquired Images:  1 image hold COMPARISON:  None FINDINGS: A single contrast study was not attempted. However, the patient was unable to voluntarily ingest any barium seemingly due to mental status issues. The study was then terminated after discussing the case with Dr. however. IMPRESSION: The esophagram was not performed. The patient was physically unable to ingest barium. Electronically Signed   By: David  Martinique M.D.   On: 03/21/2016 08:36  EKG:   Orders placed or performed during the hospital encounter of 03/17/16  . ED EKG within 10 minutes  . ED EKG within 10 minutes  . EKG    ASSESSMENT AND PLAN:   Alexander Green is a 82 y.o. male presenting with Urinary  Retention . Admitted 03/18/2016 : Day #: 2 days 1. Urinary retention in setting of prostate cancer: Foley catheter in place urology Input appreciated continue Flomax 2. Hyponatremia: [facility] consult nephrology given relative standstill 3. Metabolic encephalopathy: Question relation to sodium and combination of medications III. Prostate cancer: Hold oral cancer agents patient recently had issues with swallowing pills-instructed to stop this medicine by oncologist 4. Dysphagia: Not alert enough to successfully complete barium study 5. GERD without esophagitis PPI therapy   All the records are reviewed and case discussed with Care Management/Social Workerr. Management plans discussed with the patient, family and they are in agreement.  CODE STATUS: full TOTAL TIME TAKING CARE OF THIS PATIENT: 33 minutes.   POSSIBLE D/C IN 2-3DAYS, DEPENDING ON CLINICAL CONDITION.   Hower,  Karenann Cai.D on 03/21/2016 at 1:14 PM  Between 7am to 6pm - Pager - [phone]  After 6pm: House Pager: - [facility]  Office  [phone]  CC: Primary care physician; Otilio Miu, MD

## 2016-03-21 NOTE — Progress Notes (Signed)
Per Dr. Lavetta Nielsen okay to give pt oral meds crushed.

## 2016-03-21 NOTE — Consult Note (Signed)
Date: 03/21/2016                  Patient Name:  Alexander Green  MRN: [id number]  DOB: May 13, 1929  Age / Sex: 82 y.o., male         PCP: Otilio Miu, MD                 Service Requesting Consult: Internal medicine                 Reason for Consult: hyponatremia            History of Present Illness: Patient is a 82 y.o. male with medical problems of prostate cancer, hip fracture 2015, knee replacement 2009, coronary disease 2000, esophageal stricture 2015, who was admitted to [facility] on 03/18/2016 for evaluation of 2 days of urinary retention and progressive confusion. Patient is not able to provide any meaningful information.  All information is provided by his wife She reports that patient's oral intake has been very poor.  He mostly drinks water and ensure.  He tries to eat 3 meals per day.  She reports that he had pill esophagitis from [facility] recently, therefore it was stopped. For urinary retention, Foley catheter was placed. Good urine output was obtained He was also noted to have low sodium of 115.  Patient was prescribed IV normal saline which resulted in improvement of sodium 129 until yesterday morning.  This morning, his sodium has dropped 125.  Repeat check again shows sodium of 125 [name] oral intake remains poor.  He was not able to undergo barium swallow study as he is not able to swallow properly    Medications: Outpatient medications: Facility-administered medications prior to admission  Medication Dose Route Frequency Provider Last Rate Last Dose  . cyanocobalamin ((VITAMIN B-12)) injection 1,000 mcg  1,000 mcg Intramuscular Once Juline Patch, MD       Prescriptions prior to admission  Medication Sig Dispense Refill Last Dose  . ALPRAZolam (XANAX) 0.5 MG tablet Take 0.5 mg by mouth at bedtime.    03/18/2016 at Unknown time  . aspirin 81 MG tablet Take 81 mg by mouth.   03/18/2016 at Unknown time  . Calcium Carbonate-Vitamin D (CALCIUM 600+D) 600-400 MG-UNIT  tablet Take 1 tablet by mouth daily.   03/18/2016 at Unknown time  . cetirizine (ZYRTEC) 10 MG tablet Take 10 mg by mouth daily.   03/18/2016 at Unknown time  . clopidogrel (PLAVIX) 75 MG tablet Take 75 mg by mouth.   03/18/2016 at Unknown time  . Cyanocobalamin 1000 MCG/ML KIT Inject 1 mL as directed every 30 (thirty) days.    Past Month at Unknown time  . ferrous sulfate 325 (65 FE) MG tablet Take 325 mg by mouth daily with breakfast.   03/18/2016 at Unknown time  . ipratropium (ATROVENT) 0.06 % nasal spray Place 2 sprays into both nostrils daily.    03/18/2016 at Unknown time  . Leuprolide Acetate, 6 Month, (LUPRON DEPOT) 45 MG injection Inject 45 mg as directed every 6 (six) months. Dr Jacqlyn Larsen   unknown at unknown  . metoprolol succinate (TOPROL-XL) 50 MG 24 hr tablet Take 50 mg by mouth daily.    03/18/2016 at 1600  . nitroGLYCERIN (NITRODUR - DOSED IN MG/24 HR) 0.1 mg/hr patch Place 0.1 mg onto the skin daily.    03/18/2016 at Unknown time  . pantoprazole (PROTONIX) 40 MG tablet Take 40 mg by mouth daily. Reported on 03/07/2016   03/18/2016  at Unknown time  . simvastatin (ZOCOR) 40 MG tablet Take 40 mg by mouth.   03/18/2016 at Unknown time  . TraMADol HCl 100 MG TB24 Take 100 mg by mouth daily as needed (pain).    prn at prn    Current medications: Current Facility-Administered Medications  Medication Dose Route Frequency Provider Last Rate Last Dose  . 0.9 %  sodium chloride infusion   Intravenous Continuous Lytle Butte, MD 75 mL/hr at 03/21/16 [phone]    . acetaminophen (TYLENOL) tablet 650 mg  650 mg Oral Q6H PRN Lance Coon, MD       Or  . acetaminophen (TYLENOL) suppository 650 mg  650 mg Rectal Q6H PRN Lance Coon, MD      . aspirin chewable tablet 81 mg  81 mg Oral Daily Lance Coon, MD   81 mg at 03/21/16 1101  . clopidogrel (PLAVIX) tablet 75 mg  75 mg Oral Daily Lance Coon, MD   75 mg at 03/21/16 1101  . enoxaparin (LOVENOX) injection 40 mg  40 mg Subcutaneous Q24H Lance Coon, MD   40 mg at  03/20/16 2110  . feeding supplement (ENSURE ENLIVE) (ENSURE ENLIVE) liquid 237 mL  237 mL Oral BID BM Lytle Butte, MD   237 mL at 03/20/16 1400  . haloperidol lactate (HALDOL) injection 2 mg  2 mg Intravenous Q6H PRN Lance Coon, MD   2 mg at 03/20/16 0103  . metoprolol succinate (TOPROL-XL) 24 hr tablet 50 mg  50 mg Oral Daily Lance Coon, MD   50 mg at 03/21/16 1102  . nitroGLYCERIN (NITRODUR - Dosed in mg/24 hr) patch 0.1 mg  0.1 mg Transdermal Daily Lytle Butte, MD   0.1 mg at 03/21/16 1101  . ondansetron (ZOFRAN) tablet 4 mg  4 mg Oral Q6H PRN Lance Coon, MD       Or  . ondansetron [facility]) injection 4 mg  4 mg Intravenous Q6H PRN Lance Coon, MD      . pantoprazole sodium (PROTONIX) 40 mg/20 mL oral suspension 40 mg  40 mg Per Tube Daily Lytle Butte, MD   40 mg at 03/21/16 1134  . potassium chloride 10 mEq in 100 mL IVPB  10 mEq Intravenous Q1 Hr x 2 Lytle Butte, MD   10 mEq at 03/21/16 1420  . simvastatin (ZOCOR) tablet 40 mg  40 mg Oral q1800 Lance Coon, MD   40 mg at 03/20/16 1752  . tamsulosin (FLOMAX) capsule 0.4 mg  0.4 mg Oral Daily Lytle Butte, MD   0.4 mg at 03/21/16 1101      Allergies: Allergies  Allergen Reactions  . Celecoxib Rash      Past Medical History: Past Medical History  Diagnosis Date  . GERD (gastroesophageal reflux disease)   . Hypertension   . Hyperlipidemia   . B12 deficiency   . Stroke ([place])   . Neuromuscular disorder ([place])   . CAD (coronary artery disease)   . COPD (chronic obstructive pulmonary disease) ([place])   . Prostate cancer [facility]) 2009    Cryoablation with Dr. Jacqlyn Larsen     Past Surgical History: Past Surgical History  Procedure Laterality Date  . Back surgery    . Hernia repair    . Hip replacemnt (r)    . Knee surgery Right   . Colonoscopy  2010     Family History: Family History  Problem Relation Age of Onset  . CAD Brother   . Stomach cancer  Brother   . Prostate cancer Brother      Social History: Social  History   Social History  . Marital Status: Married    Spouse Name: N/A  . Number of Children: N/A  . Years of Education: N/A   Occupational History  . Not on file.   Social History Main Topics  . Smoking status: Never Smoker   . Smokeless tobacco: Not on file  . Alcohol Use: No  . Drug Use: No  . Sexual Activity: No   Other Topics Concern  . Not on file   Social History Narrative     Review of Systems: Limited evaluation.  Patient is not able to provide much information Gen:  HEENT:  CV:  Resp:  GI: GU :  MS:  Derm:   Psych: Heme:  Neuro:  Endocrine  Vital Signs: Blood pressure 135/86, pulse 75, temperature 98.6 F (37 C), temperature source Axillary, resp. rate 18, height 6' 5"  (1.956 m), weight 72.576 kg (160 lb), SpO2 97 %.   Intake/Output Summary (Last 24 hours) at 03/21/16 1444 Last data filed at 03/21/16 1135  Gross per 24 hour  Intake   1701 ml  Output   2175 ml  Net   -474 ml    Weight trends: [employer]   03/18/16 2240  Weight: 72.576 kg (160 lb)    Physical Exam: General:  thin, cachectic, lying in the bed, chronically ill-appearing  HEENT anicteric,somewhat dry oral mucous membranes  Neck:  supple  Lungs: Tachypnea,coarse breath sounds at bases bilaterally  Heart:: i rregular  Abdomen: Soft, nondistended, nontender  Extremities:  no peripheral edema  Neurologic: lethargic, answers only a few questions  Skin: No acute rashes     Foley: present       Lab results: Basic Metabolic Panel:  Recent Labs Lab 03/20/16 0533 03/21/16 0446 03/21/16 1353  NA 129* 125* 125*  K 3.4* 3.4* 3.3*  CL 101 96* 96*  CO2 19* 20* 21*  GLUCOSE 99 109* 110*  BUN 8 10 9   CREATININE 0.79 0.50* 0.56*  CALCIUM 8.8* 8.4* 8.2*    Liver Function Tests:  Recent Labs Lab 03/19/16 0029  AST 33  ALT 14*  ALKPHOS 58  BILITOT 1.4*  PROT 6.1*  ALBUMIN 4.0   No results for input(s): LIPASE, AMYLASE in the last 168 hours. No results for  input(s): AMMONIA in the last 168 hours.  CBC:  Recent Labs Lab 03/19/16 0029 03/21/16 0446  WBC 8.6 5.4  HGB 12.3* 11.3*  HCT 34.5* 31.8*  MCV 82.0 82.7  PLT 137* 113*    Cardiac Enzymes:  Recent Labs Lab 03/19/16 0029  TROPONINI <0.03    BNP: Invalid input(s): POCBNP  CBG: No results for input(s): GLUCAP in the last 168 hours.  Microbiology: No results found for this or any previous visit (from the past 720 hour(s)).   Coagulation Studies: No results for input(s): LABPROT, INR in the last 72 hours.  Urinalysis:  Recent Labs  03/18/16 2259  COLORURINE YELLOW*  LABSPEC 1.008  PHURINE 7.0  GLUCOSEU NEGATIVE  HGBUR 3+*  BILIRUBINUR NEGATIVE  KETONESUR NEGATIVE  PROTEINUR 30*  NITRITE NEGATIVE  LEUKOCYTESUR NEGATIVE        Imaging: Dg Esophagus  03/21/2016  CLINICAL DATA:  Dysphagia EXAM: ESOPHOGRAM/BARIUM SWALLOW TECHNIQUE: Single contrast examination was attempted using thin barium or water soluble. FLUOROSCOPY TIME:  Fluoroscopy Time:  0 minutes, 18 seconds Number of Acquired Images:  1 image hold COMPARISON:  None  FINDINGS: A single contrast study was not attempted. However, the patient was unable to voluntarily ingest any barium seemingly due to mental status issues. The study was then terminated after discussing the case with Dr. however. IMPRESSION: The esophagram was not performed. The patient was physically unable to ingest barium. Electronically Signed   By: David  Martinique M.D.   On: 03/21/2016 08:36      Assessment & Plan: Pt is a 82 y.o. yo male with medical problems of castrate resistant prostate cancer, Lung nodule, hip fracture 2015, knee replacement 2009, coronary disease 2000, esophageal stricture 2015, who was admitted to [facility] on 03/18/2016 for evaluation of 2 days of urinary retention and progressive confusion.  1.  Hyponatremia - Likely multifactorial.  [name] by mouth intake is very poor therefore he probably has a component of volume  depletion.  It should have been corrected with IV saline that he received.  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 His oral intake mostly consists of 3-4 ounce of free water, and sugar and maybe small meals which is consistent with low solute diet Urine osmolality is 254, specific gravity 1.008 suggesting dilute urine  Plan: Patient is n.p.o. For now until his swallowing status is clear.  This would restrict his free water Suggests using very low dose Lasix in combination with salt tablets and see if that helps improve his sodium  2. Hypokalemia Would replace with potassium chloride IV

## 2016-03-22 ENCOUNTER — Inpatient Hospital Stay: Payer: Medicare Other

## 2016-03-22 LAB — BASIC METABOLIC PANEL
Anion gap: 8 (ref 5–15)
BUN: 9 mg/dL (ref 6–20)
CALCIUM: 8.5 mg/dL — AB (ref 8.9–10.3)
CHLORIDE: 97 mmol/L — AB (ref 101–111)
CO2: 21 mmol/L — ABNORMAL LOW (ref 22–32)
CREATININE: 0.64 mg/dL (ref 0.61–1.24)
Glucose, Bld: 107 mg/dL — ABNORMAL HIGH (ref 65–99)
Potassium: 3.5 mmol/L (ref 3.5–5.1)
SODIUM: 126 mmol/L — AB (ref 135–145)

## 2016-03-22 MED ORDER — FUROSEMIDE 20 MG PO TABS
20.0000 mg | ORAL_TABLET | Freq: Every day | ORAL | Status: DC
  Administered 2016-03-22 – 2016-03-23 (×2): 20 mg via ORAL
  Filled 2016-03-22 (×2): qty 1

## 2016-03-22 NOTE — Progress Notes (Signed)
Subjective:  Patient is more alert today Denies acute complaints or shortness of breath Wife is at bedside   Objective:  Vital signs in last 24 hours:  Temp:  [97.9 F (36.6 C)-98.4 F (36.9 C)] 97.9 F (36.6 C) (06/07 0534) Pulse Rate:  [71-82] 73 (06/07 1037) Resp:  [18] 18 (06/07 0534) BP: (113-152)/(58-89) 145/69 mmHg (06/07 1037) SpO2:  [95 %-99 %] 99 % (06/07 0534)  Weight change:  Filed Weights   03/18/16 2240  Weight: 72.576 kg (160 lb)    Intake/Output:    Intake/Output Summary (Last 24 hours) at 03/22/16 1426 Last data filed at 03/22/16 1200  Gross per 24 hour  Intake   2032 ml  Output   2000 ml  Net     32 ml     Physical Exam: General: No acute distress laying in the bed  HEENT Moist oral mucous membranes  Neck supple  Pulm/lungs Shallow breaths but clear anteriorly and laterally  CVS/Heart irregular, no rub or gallop  Abdomen:  Soft, nontender, nondistended  Extremities: No peripheral edema  Neurologic: Alert, follows few commands  Skin: No acute rashes  Foley present       Basic Metabolic Panel:   Recent Labs Lab 03/19/16 1206 03/20/16 0533 03/21/16 0446 03/21/16 1353 03/22/16 0032  NA 123* 129* 125* 125* 126*  K 4.0 3.4* 3.4* 3.3* 3.5  CL 94* 101 96* 96* 97*  CO2 22 19* 20* 21* 21*  GLUCOSE 107* 99 109* 110* 107*  BUN 8 8 10 9 9   CREATININE 0.82 0.79 0.50* 0.56* 0.64  CALCIUM 8.6* 8.8* 8.4* 8.2* 8.5*     CBC:  Recent Labs Lab 03/17/16 1051 03/19/16 0029 03/21/16 0446  WBC 5.4 8.6 5.4  HGB 12.7* 12.3* 11.3*  HCT 36.2* 34.5* 31.8*  MCV 83.6 82.0 82.7  PLT 129* 137* 113*      Microbiology:  No results found for this or any previous visit (from the past 720 hour(s)).  Coagulation Studies: No results for input(s): LABPROT, INR in the last 72 hours.  Urinalysis: No results for input(s): COLORURINE, LABSPEC, PHURINE, GLUCOSEU, HGBUR, BILIRUBINUR, KETONESUR, PROTEINUR, UROBILINOGEN, NITRITE, LEUKOCYTESUR in the  last 72 hours.  Invalid input(s): APPERANCEUR    Imaging: Dg Esophagus  03/21/2016  CLINICAL DATA:  Dysphagia EXAM: ESOPHOGRAM/BARIUM SWALLOW TECHNIQUE: Single contrast examination was attempted using thin barium or water soluble. FLUOROSCOPY TIME:  Fluoroscopy Time:  0 minutes, 18 seconds Number of Acquired Images:  1 image hold COMPARISON:  None FINDINGS: A single contrast study was not attempted. However, the patient was unable to voluntarily ingest any barium seemingly due to mental status issues. The study was then terminated after discussing the case with Dr. however. IMPRESSION: The esophagram was not performed. The patient was physically unable to ingest barium. Electronically Signed   By: David  Martinique M.D.   On: 03/21/2016 08:36     Medications:   . sodium chloride 75 mL/hr at 03/22/16 1037   . aspirin  81 mg Oral Daily  . clopidogrel  75 mg Oral Daily  . enoxaparin (LOVENOX) injection  40 mg Subcutaneous Q24H  . feeding supplement (ENSURE ENLIVE)  237 mL Oral BID BM  . furosemide  20 mg Oral Daily  . metoprolol succinate  50 mg Oral Daily  . nitroGLYCERIN  0.1 mg Transdermal Daily  . pantoprazole sodium  40 mg Per Tube Daily  . simvastatin  40 mg Oral q1800  . tamsulosin  0.4 mg Oral Daily  acetaminophen **OR** acetaminophen, haloperidol lactate, ondansetron **OR** ondansetron (ZOFRAN) IV  Assessment/ Plan:  82 y.o. male with medical problems of castrate resistant prostate cancer, Lung nodule, hip fracture 2015, knee replacement 2009, coronary disease 2000, esophageal stricture 2015, who was admitted to [facility] on 03/18/2016 for evaluation of 2 days of urinary retention and progressive confusion.  1. Hyponatremia - Likely multifactorial. [name] oral intake is very poor therefore he probably has a component of volume depletion. It should have been corrected with IV saline that he received. 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  His oral intake mostly consists of 3-4 ounce of free water, and maybe small meals which is consistent with low solute diet Urine osmolality is 254, specific gravity 1.008 suggesting dilute urine  Plan: continue using very low dose Lasix in combination with saline and see if that helps improve his sodium  2. Hypokalemia Would replace with potassium chloride IV    LOS: 3 Alexander Green 6/7/20172:26 PM

## 2016-03-22 NOTE — Evaluation (Signed)
Physical Therapy Evaluation Patient Details Name: Alexander Green MRN: [id number] DOB: 1929/08/16 Today's Date: 03/22/2016   History of Present Illness  Pt is admitted for urinary retention and increased confusion. Pt with history of prostate cancer, GERD, HTN, CAD, and COPD  Clinical Impression  Pt is a pleasant 82 year old male who was admitted for urinary retention. Pt performs bed mobility with min assist and transfers/ambulation with cga and rw. Pt demonstrates deficits with strength/mobility. Pt ambulated with bedroom shoes on, however would benefit from further ambulation with grip socks as shoes kept sliding on floor. Pt with chronic foot drop per wife and has braces at home, not available for donning at evaluation. Would benefit from skilled PT to address above deficits and promote optimal return to PLOF. Recommend transition to [place] upon discharge from acute hospitalization.       Follow Up Recommendations Home health PT    Equipment Recommendations       Recommendations for Other Services       Precautions / Restrictions Precautions Precautions: Fall Restrictions Weight Bearing Restrictions: No      Mobility  Bed Mobility Overal bed mobility: Needs Assistance Bed Mobility: Supine to Sit     Supine to sit: Min assist     General bed mobility comments: assist for bed mobility with trunk control and scooting out towards EOB. Safe technique performed and pt able to sit at EOB with supervision  Transfers Overall transfer level: Needs assistance Equipment used: Rolling walker (2 wheeled) Transfers: Sit to/from Stand Sit to Stand: Min guard         General transfer comment: transfers performed with cga and rw. Pt with forward flexed posture, however able to self correct with cues  Ambulation/Gait Ambulation/Gait assistance: Min guard Ambulation Distance (Feet): 40 Feet Assistive device: Rolling walker (2 wheeled) Gait Pattern/deviations: Step-to  pattern     General Gait Details: ambulated using rw and cga. Pt presents with shuffling step to gait pattern. Pt needs cues for guidance of rw and obstacle avoidance. Pt fatigues quickly with ambulation.  Stairs            Wheelchair Mobility    Modified Rankin (Stroke Patients Only)       Balance Overall balance assessment: Needs assistance Sitting-balance support: Feet supported Sitting balance-Leahy Scale: Normal     Standing balance support: Bilateral upper extremity supported Standing balance-Leahy Scale: Fair                               Pertinent Vitals/Pain Pain Assessment: No/denies pain    Home Living Family/patient expects to be discharged to:: Private residence Living Arrangements: Spouse/significant other Available Help at Discharge: Family;Available 24 hours/day Type of Home: House Home Access: Ramped entrance     Home Layout: One level Home Equipment: Walker - 4 wheels;Shower seat;Toilet riser;Electric scooter      Prior Function Level of Independence: Needs assistance   Gait / Transfers Assistance Needed: used Marketing executive for all mobility and uses Transport planner for community mobility           Hand Dominance        Extremity/Trunk Assessment   Upper Extremity Assessment: Generalized weakness (B UE grossly 4/5)           Lower Extremity Assessment: Generalized weakness (B LE grossly 4/5)         Communication   Communication: No difficulties  Cognition Arousal/Alertness: Awake/alert Behavior During Therapy:  WFL for tasks assessed/performed Overall Cognitive Status: Impaired/Different from baseline                      General Comments      Exercises Other Exercises Other Exercises: Pt performed supine ther-ex x 10 reps on B LE including SLRs, hip abd/add, and heel slides. All ther-ex performed x 10 reps with cga and cues for correct technique.      Assessment/Plan    PT Assessment Patient  needs continued PT services  PT Diagnosis Difficulty walking;Generalized weakness   PT Problem List Decreased strength;Decreased balance;Decreased mobility  PT Treatment Interventions Gait training;DME instruction;Therapeutic exercise   PT Goals (Current goals can be found in the Care Plan section) Acute Rehab PT Goals Patient Stated Goal: to get stronger PT Goal Formulation: With patient Time For Goal Achievement: 04/05/16 Potential to Achieve Goals: Good    Frequency Min 2X/week   Barriers to discharge        Co-evaluation               End of Session Equipment Utilized During Treatment: Gait belt Activity Tolerance: Patient tolerated treatment well Patient left: in bed;with bed alarm set Nurse Communication: Mobility status         Time: [id number] PT Time Calculation (min) (ACUTE ONLY): 26 min   Charges:   PT Evaluation $PT Eval Moderate Complexity: 1 Procedure PT Treatments $Therapeutic Exercise: 8-22 mins   PT G Codes:        Jenesis Martin April 02, 2016, 2:43 PM  Greggory Stallion, PT, DPT [phone]

## 2016-03-22 NOTE — Progress Notes (Signed)
[facility] [place] NAME: Alexander Green    MRN#:  [id number]  DATE OF BIRTH:  05/30/1929  SUBJECTIVE:  Hospital Day: 3 days Alexander Green is a 82 y.o. male presenting with Urinary Retention .   Overnight events: Received Haldol overnight for agitation Interval Events: Confused But much more alert, wife  present at bedside  REVIEW OF SYSTEMS:  Unable to obtain given patient's mental status  DRUG ALLERGIES:   Allergies  Allergen Reactions  . Celecoxib Rash    VITALS:  Blood pressure 145/69, pulse 73, temperature 97.9 F (36.6 C), temperature source Oral, resp. rate 18, height 6\' 5"  (1.956 m), weight 160 lb (72.576 kg), SpO2 99 %.  PHYSICAL EXAMINATION:   VITAL SIGNS: Filed Vitals:   03/22/16 0534 03/22/16 1037  BP: 113/58 145/69  Pulse: 71 73  Temp: 97.9 F (36.6 C)   Resp: 18    GENERAL:82 y.o.male moderate distress given mental status.  HEAD: Normocephalic, atraumatic.  EYES: Pupils equal, round, reactive to light. Unable to assess extraocular muscles given mental status/medical condition. No scleral icterus.  MOUTH: Moist mucosal membrane. Dentition intact. No abscess noted.  EAR, NOSE, THROAT: Clear without exudates. No external lesions.  NECK: Supple. No thyromegaly. No nodules. No JVD.  PULMONARY: Clear to ascultation, without wheeze rails or rhonci. No use of accessory muscles, Good respiratory effort. good air entry bilaterally CHEST: Nontender to palpation.  CARDIOVASCULAR: S1 and S2. Regular rate and rhythm. No murmurs, rubs, or gallops. No edema. Pedal pulses 2+ bilaterally.  GASTROINTESTINAL: Soft, nontender, nondistended. No masses. Positive bowel sounds. No hepatosplenomegaly.  MUSCULOSKELETAL: No swelling, clubbing, or edema. Range of motion full in all extremities.  NEUROLOGIC: Unable to assess given mental status/medical condition SKIN: No ulceration, lesions, rashes, or cyanosis. Skin warm and dry.  Turgor intact.  PSYCHIATRIC: Unable to assess given mental status/medical condition        LABORATORY PANEL:   CBC  Recent Labs Lab 03/21/16 0446  WBC 5.4  HGB 11.3*  HCT 31.8*  PLT 113*   ------------------------------------------------------------------------------------------------------------------  Chemistries   Recent Labs Lab 03/19/16 0029  03/22/16 0032  NA 115*  < > 126*  K 4.0  < > 3.5  CL 86*  < > 97*  CO2 17*  < > 21*  GLUCOSE 131*  < > 107*  BUN 11  < > 9  CREATININE 0.76  < > 0.64  CALCIUM 8.8*  < > 8.5*  AST 33  --   --   ALT 14*  --   --   ALKPHOS 58  --   --   BILITOT 1.4*  --   --   < > = values in this interval not displayed. ------------------------------------------------------------------------------------------------------------------  Cardiac Enzymes  Recent Labs Lab 03/19/16 0029  TROPONINI <0.03   ------------------------------------------------------------------------------------------------------------------  RADIOLOGY:  Dg Esophagus  03/21/2016  CLINICAL DATA:  Dysphagia EXAM: ESOPHOGRAM/BARIUM SWALLOW TECHNIQUE: Single contrast examination was attempted using thin barium or water soluble. FLUOROSCOPY TIME:  Fluoroscopy Time:  0 minutes, 18 seconds Number of Acquired Images:  1 image hold COMPARISON:  None FINDINGS: A single contrast study was not attempted. However, the patient was unable to voluntarily ingest any barium seemingly due to mental status issues. The study was then terminated after discussing the case with Dr. however. IMPRESSION: The esophagram was not performed. The patient was physically unable to ingest barium. Electronically Signed   By: David  Martinique M.D.   On: 03/21/2016  08:36    EKG:   Orders placed or performed during the hospital encounter of 03/17/16  . ED EKG within 10 minutes  . ED EKG within 10 minutes  . EKG    ASSESSMENT AND PLAN:   Alexander Green is a 82 y.o. male presenting with Urinary  Retention . Admitted 03/18/2016 : Day #: 3 days 1. Urinary retention in setting of prostate cancer: Foley catheter in place urology Input appreciated continue Flomax 2. Hyponatremia: Nephrology input appreciated continue current course recheck tomorrow 3. Metabolic encephalopathy: Finally with some improvement III. Prostate cancer: Hold oral cancer agents patient recently had issues with swallowing pills-instructed to stop this medicine by oncologist 4. Dysphagia: With improvement mental status we'll reattempt barium study regardless we'll place on diet later today 5. GERD without esophagitis PPI therapy   All the records are reviewed and case discussed with Care Management/Social Workerr. Management plans discussed with the patient, family and they are in agreement.  CODE STATUS: full TOTAL TIME TAKING CARE OF THIS PATIENT: 28 minutes.   POSSIBLE D/C IN 2-3DAYS, DEPENDING ON CLINICAL CONDITION.   Hower,  Karenann Cai.D on 03/22/2016 at 1:51 PM  Between 7am to 6pm - Pager - [phone]  After 6pm: House Pager: - [phone]  [facility]  Office  [phone]  CC: Primary care physician; Otilio Miu, MD

## 2016-03-22 NOTE — Progress Notes (Signed)
Speech Therapy Note: reviewed chart notes; consulted NSG. Pt remains NPO. Unsure if proceeding w/ GI swallow study today; NSG unsure. NSG will consult MD re: GI study or reinitiating oral diet. Pt may need a more modified diet d/t declined Cognitive status requiring medications to calm. ST will f/u today.

## 2016-03-22 NOTE — Plan of Care (Signed)
Problem: Education: Goal: Knowledge of [facility] Education information/materials will improve Outcome: Not Progressing Pt confused  Problem: Health Behavior/Discharge Planning: Goal: Ability to manage health-related needs will improve Outcome: Not Progressing Pt confused

## 2016-03-22 NOTE — Plan of Care (Signed)
Problem: Safety: Goal: Ability to remain free from injury will improve Outcome: Not Progressing Patient very impulsive trying to leave bed multiple times throughout night.  Haldol given x 1.  Patient finally calmed down and able to rest comfortably 30 minutes later.

## 2016-03-23 ENCOUNTER — Inpatient Hospital Stay: Payer: Medicare Other

## 2016-03-23 LAB — URINALYSIS COMPLETE WITH MICROSCOPIC (ARMC ONLY)
BILIRUBIN URINE: NEGATIVE
Bacteria, UA: NONE SEEN
Glucose, UA: NEGATIVE mg/dL
NITRITE: NEGATIVE
PROTEIN: 100 mg/dL — AB
Specific Gravity, Urine: 1.015 (ref 1.005–1.030)
Squamous Epithelial / LPF: NONE SEEN
pH: 6 (ref 5.0–8.0)

## 2016-03-23 LAB — BASIC METABOLIC PANEL
ANION GAP: 11 (ref 5–15)
BUN: 10 mg/dL (ref 6–20)
CALCIUM: 8.6 mg/dL — AB (ref 8.9–10.3)
CO2: 18 mmol/L — ABNORMAL LOW (ref 22–32)
Chloride: 99 mmol/L — ABNORMAL LOW (ref 101–111)
Creatinine, Ser: 0.64 mg/dL (ref 0.61–1.24)
Glucose, Bld: 102 mg/dL — ABNORMAL HIGH (ref 65–99)
Potassium: 3 mmol/L — ABNORMAL LOW (ref 3.5–5.1)
SODIUM: 128 mmol/L — AB (ref 135–145)

## 2016-03-23 LAB — GLUCOSE, CAPILLARY: Glucose-Capillary: 117 mg/dL — ABNORMAL HIGH (ref 65–99)

## 2016-03-23 MED ORDER — POTASSIUM CHLORIDE IN NACL 20-0.9 MEQ/L-% IV SOLN
INTRAVENOUS | Status: DC
  Administered 2016-03-24: 10:00:00 via INTRAVENOUS
  Filled 2016-03-23 (×3): qty 1000

## 2016-03-23 NOTE — Progress Notes (Signed)
Subjective:  Patient is confused today [name] family reports that until last evening, he was coherent and able to communicate Wife is at bedside along with son and daughter-in-law. Sodium level improved to 120 today Potassium is however, low at 3.0   Objective:  Vital signs in last 24 hours:  Temp:  [97.4 F (36.3 C)-97.5 F (36.4 C)] 97.4 F (36.3 C) (06/08 0812) Pulse Rate:  [72-89] 87 (06/08 0812) Resp:  [17-22] 18 (06/08 0812) BP: (122-158)/(66-90) 156/82 mmHg (06/08 0812) SpO2:  [93 %-97 %] 94 % (06/08 0812)  Weight change:  Filed Weights   03/18/16 2240  Weight: 72.576 kg (160 lb)    Intake/Output:    Intake/Output Summary (Last 24 hours) at 03/23/16 1134 Last data filed at 03/23/16 0700  Gross per 24 hour  Intake   1724 ml  Output   2450 ml  Net   -726 ml     Physical Exam: General: No acute distress laying in the bed  HEENT Moist oral mucous membranes  Neck supple  Pulm/lungs Shallow breaths but clear anteriorly and laterally  CVS/Heart irregular, no rub or gallop  Abdomen:  Soft, nontender, nondistended  Extremities: No peripheral edema  Neurologic: Delirious, not following commands today  Skin: No acute rashes  Foley present       Basic Metabolic Panel:   Recent Labs Lab 03/20/16 0533 03/21/16 0446 03/21/16 1353 03/22/16 0032 03/23/16 1032  NA 129* 125* 125* 126* 128*  K 3.4* 3.4* 3.3* 3.5 3.0*  CL 101 96* 96* 97* 99*  CO2 19* 20* 21* 21* 18*  GLUCOSE 99 109* 110* 107* 102*  BUN 8 10 9 9 10   CREATININE 0.79 0.50* 0.56* 0.64 0.64  CALCIUM 8.8* 8.4* 8.2* 8.5* 8.6*     CBC:  Recent Labs Lab 03/17/16 1051 03/19/16 0029 03/21/16 0446  WBC 5.4 8.6 5.4  HGB 12.7* 12.3* 11.3*  HCT 36.2* 34.5* 31.8*  MCV 83.6 82.0 82.7  PLT 129* 137* 113*      Microbiology:  No results found for this or any previous visit (from the past 720 hour(s)).  Coagulation Studies: No results for input(s): LABPROT, INR in the last 72  hours.  Urinalysis: No results for input(s): COLORURINE, LABSPEC, PHURINE, GLUCOSEU, HGBUR, BILIRUBINUR, KETONESUR, PROTEINUR, UROBILINOGEN, NITRITE, LEUKOCYTESUR in the last 72 hours.  Invalid input(s): APPERANCEUR    Imaging: Dg Esophagus  03/22/2016  CLINICAL DATA:  Dysphagia, difficulty swallowing EXAM: ESOPHOGRAM/BARIUM SWALLOW TECHNIQUE: Single contrast examination was performed using  thin barium. FLUOROSCOPY TIME:  Radiation Exposure Index (as provided by the fluoroscopic device): 0.8 mGy COMPARISON:  None. FINDINGS: The patient was placed in a semi upright position. The patient was asked to drink thin barium. The patient is unable to drink thin barium follow multiple attempts. There is overall difficulty initiating an oral phase. IMPRESSION: Aborted esophagram. Recommend evaluation with cine with speech pathology. Electronically Signed   By: Kathreen Devoid   On: 03/22/2016 16:05     Medications:   . 0.9 % NaCl with KCl 20 mEq / L     . aspirin  81 mg Oral Daily  . clopidogrel  75 mg Oral Daily  . enoxaparin (LOVENOX) injection  40 mg Subcutaneous Q24H  . feeding supplement (ENSURE ENLIVE)  237 mL Oral BID BM  . furosemide  20 mg Oral Daily  . metoprolol succinate  50 mg Oral Daily  . nitroGLYCERIN  0.1 mg Transdermal Daily  . pantoprazole sodium  40 mg Per Tube Daily  .  simvastatin  40 mg Oral q1800  . tamsulosin  0.4 mg Oral Daily   acetaminophen **OR** acetaminophen, haloperidol lactate, ondansetron **OR** ondansetron (ZOFRAN) IV  Assessment/ Plan:  82 y.o. male with medical problems of castrate resistant prostate cancer, Lung nodule, hip fracture 2015, knee replacement 2009, coronary disease 2000, esophageal stricture 2015, who was admitted to [facility] on 03/18/2016 for evaluation of 2 days of urinary retention and progressive confusion.  1. Hyponatremia - Likely multifactorial. [name] oral intake is very poor therefore he probably has a component of volume depletion. It  should have been corrected with IV saline that he received. 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 His oral intake mostly consists of low solute diet Urine osmolality is 254, specific gravity 1.008 suggesting dilute urine  Plan: continue using very low dose Lasix in combination with saline and see if that helps improve his sodium  2. Hypokalemia Would replace with potassium chloride IV    LOS: 4 [place] 6/8/201711:34 AM

## 2016-03-23 NOTE — Progress Notes (Signed)
[place] at [place] NAME: Alexander Green    MRN#:  [id number]  DATE OF BIRTH:  1929/08/27  SUBJECTIVE:  Hospital Day: 4 days Alexander Green is a 82 y.o. male presenting with Urinary Retention .   Overnight events: No overnight events Interval Events: Decline in mental status today wife  present at bedside  REVIEW OF SYSTEMS:  Unable to obtain given patient's mental status  DRUG ALLERGIES:   Allergies  Allergen Reactions  . Celecoxib Rash    VITALS:  Blood pressure 131/98, pulse 90, temperature 99.2 F (37.3 C), temperature source Oral, resp. rate 20, height 6\' 5"  (1.956 m), weight 160 lb (72.576 kg), SpO2 90 %.  PHYSICAL EXAMINATION:   VITAL SIGNS: Filed Vitals:   03/23/16 0812 03/23/16 1338  BP: 156/82 131/98  Pulse: 87 90  Temp: 97.4 F (36.3 C) 99.2 F (37.3 C)  Resp: 18 20   GENERAL:82 y.o.male moderate distress given mental status.  HEAD: Normocephalic, atraumatic.  EYES: Pupils equal, round, reactive to light. Unable to assess extraocular muscles given mental status/medical condition. No scleral icterus.  MOUTH: Moist mucosal membrane. Dentition intact. No abscess noted.  EAR, NOSE, THROAT: Clear without exudates. No external lesions.  NECK: Supple. No thyromegaly. No nodules. No JVD.  PULMONARY: Clear to ascultation, without wheeze rails or rhonci. No use of accessory muscles, Good respiratory effort. good air entry bilaterally CHEST: Nontender to palpation.  CARDIOVASCULAR: S1 and S2. Regular rate and rhythm. No murmurs, rubs, or gallops. No edema. Pedal pulses 2+ bilaterally.  GASTROINTESTINAL: Soft, nontender, nondistended. No masses. Positive bowel sounds. No hepatosplenomegaly.  MUSCULOSKELETAL: No swelling, clubbing, or edema. Range of motion full in all extremities.  NEUROLOGIC: Unable to assess given mental status/medical condition SKIN: No ulceration, lesions, rashes, or cyanosis. Skin warm and dry.  Turgor intact.  PSYCHIATRIC: Unable to assess given mental status/medical condition        LABORATORY PANEL:   CBC  Recent Labs Lab 03/21/16 0446  WBC 5.4  HGB 11.3*  HCT 31.8*  PLT 113*   ------------------------------------------------------------------------------------------------------------------  Chemistries   Recent Labs Lab 03/19/16 0029  03/23/16 1032  NA 115*  < > 128*  K 4.0  < > 3.0*  CL 86*  < > 99*  CO2 17*  < > 18*  GLUCOSE 131*  < > 102*  BUN 11  < > 10  CREATININE 0.76  < > 0.64  CALCIUM 8.8*  < > 8.6*  AST 33  --   --   ALT 14*  --   --   ALKPHOS 58  --   --   BILITOT 1.4*  --   --   < > = values in this interval not displayed. ------------------------------------------------------------------------------------------------------------------  Cardiac Enzymes  Recent Labs Lab 03/19/16 0029  TROPONINI <0.03   ------------------------------------------------------------------------------------------------------------------  RADIOLOGY:  Dg Esophagus  03/22/2016  CLINICAL DATA:  Dysphagia, difficulty swallowing EXAM: ESOPHOGRAM/BARIUM SWALLOW TECHNIQUE: Single contrast examination was performed using  thin barium. FLUOROSCOPY TIME:  Radiation Exposure Index (as provided by the fluoroscopic device): 0.8 mGy COMPARISON:  None. FINDINGS: The patient was placed in a semi upright position. The patient was asked to drink thin barium. The patient is unable to drink thin barium follow multiple attempts. There is overall difficulty initiating an oral phase. IMPRESSION: Aborted esophagram. Recommend evaluation with cine with speech pathology. Electronically Signed   By: Kathreen Devoid   On: 03/22/2016 16:05    EKG:  Orders placed or performed during the hospital encounter of 03/17/16  . ED EKG within 10 minutes  . ED EKG within 10 minutes  . EKG    ASSESSMENT AND PLAN:   Alexander Green is a 82 y.o. male presenting with Urinary Retention . Admitted  03/18/2016 : Day #: 4 days 1. Urinary retention in setting of prostate cancer: Foley catheter in place urology Input appreciated continue Flomax 2. Hyponatremia: Nephrology input appreciated continue current course recheck tomorrow slowly improving 3. Metabolic encephalopathy: Mental status seems to relapse today more confused and lethargic-consultative care III. Prostate cancer: Hold oral cancer agents patient recently had issues with swallowing pills-instructed to stop this medicine by oncologist 4. Dysphagia: Dysphagia diet 5. GERD without esophagitis PPI therapy  Disposition: Given lack of general progression have a lengthy discussion with the wife and patient's son and daughter in regards to goals of care. Approach the issues of dysphagia, feeding tubes versus feeding for comfort and comfort measures only. I do believe this family are in favor of comfort measures but not exactly ready to make that decision. Will consult palliative care to aid in continuing this conversation   All the records are reviewed and case discussed with Care Management/Social Workerr. Management plans discussed with the patient, family and they are in agreement.  CODE STATUS: full TOTAL TIME TAKING CARE OF THIS PATIENT: 28 minutes.   POSSIBLE D/C IN 2-3DAYS, DEPENDING ON CLINICAL CONDITION.   Alexander Green,  Karenann Cai.D on 03/23/2016 at 1:53 PM  Between 7am to 6pm - Pager - [phone]  After 6pm: House Pager: - [facility]  Office  [phone]  CC: Primary care physician; Otilio Miu, MD

## 2016-03-23 NOTE — Progress Notes (Signed)
Physical Therapy Treatment Patient Details Name: Alexander Green MRN: [id number] DOB: 1929-03-14 Today's Date: 04-07-16    History of Present Illness Pt is admitted for urinary retention and increased confusion. Pt with history of prostate cancer, GERD, HTN, CAD, and COPD    PT Comments    Pt is making limited progress towards goals and appears heavily confused this date. Unable to fully participate with therapy; no OOB mobility performed this date. Pt able to participate in limited there-ex, however extreme stiffness noted in B knees with flexion and hard end-feel. Pt resistant to knee flexion. Will continue to follow at a distance as this is significant change from previous session and palliative care consult pending.  Follow Up Recommendations   (HHPT vs potential hospice)     Equipment Recommendations       Recommendations for Other Services       Precautions / Restrictions Precautions Precautions: Fall Restrictions Weight Bearing Restrictions: No    Mobility  Bed Mobility               General bed mobility comments: unable at this time secondary to cognition; unable to follow commands  Transfers                    Ambulation/Gait                 Stairs            Wheelchair Mobility    Modified Rankin (Stroke Patients Only)       Balance                                    Cognition Arousal/Alertness: Lethargic Behavior During Therapy: Restless Overall Cognitive Status: Impaired/Different from baseline                      Exercises Other Exercises Other Exercises: Pt performed supine ther-ex/AAROM x 10 reps including B LE hip abd/add, SLRs, and B UE elbow flexion/extension. All performed with max assist and heavy cues for participation. Pt attempted SAQ and heel slides, however increased stiffness noted in B kneesl unable to flex knees. Deferred further ther-ex secondary to poor participation.     General Comments        Pertinent Vitals/Pain Pain Assessment: No/denies pain    Home Living                      Prior Function            PT Goals (current goals can now be found in the care plan section) Acute Rehab PT Goals Patient Stated Goal: unable to state PT Goal Formulation: With family Time For Goal Achievement: 04/05/16 Potential to Achieve Goals: Fair Progress towards PT goals: Not progressing toward goals - comment    Frequency  Min 2X/week    PT Plan Current plan remains appropriate    Co-evaluation             End of Session   Activity Tolerance: No increased pain Patient left: in bed;with bed alarm set     Time: [id number] PT Time Calculation (min) (ACUTE ONLY): 10 min  Charges:  $Therapeutic Exercise: 8-22 mins                    G Codes:      Ramon Zanders April 07, 2016, 5:06 PM Greggory Stallion,  PT, DPT [phone]

## 2016-03-23 NOTE — Progress Notes (Signed)
Primary nurse notified Dr. Claria Dice of patient starring off to left side, with jaw open, and not following commands. Pt did come back around and closed mouth attempting to talk. New orders received. Blood sugar stable. See flow sheet for Vital signs. Family present. Pt sent down for CT, urine sample sent to lab. Continue to assess.

## 2016-03-23 NOTE — Progress Notes (Addendum)
Speech Language Pathology Treatment: Dysphagia  Patient Details Name: Alexander Green MRN: [id number] DOB: 11-14-1928 Today's Date: 03/23/2016 Time: 1000-1045 SLP Time Calculation (min) (ACUTE ONLY): 45 min  Assessment / Plan / Recommendation Clinical Impression  Pt appears at increased risk for aspiration sec. to declined Cognitive status and severe weakness overall d/t extended illness. Pt exhibits oral phase dysphagia(slow A-P transfer and bolus management) as well as suspected pharyngeal phase deficits including delayed pharyngeal swallow initiation. Pt often exhibited a fixed stare and required mod+ cues to reattend to task. Pt required mod+ cues for stimulation during tasks. Due to pt's presentation and increased risk for aspiration, recommend downgrade of diet consistency w/ strict aspiration precautions and feeding monitoring w/ any po's. This was discussed w/ MD and NSG; wife and pt. ST will f/u w/ toleration of diet; trials to upgrade diet to least restrictive consistency when appropriate next 2-3 days. Wife agreed.   HPI HPI: Alexander Green is a 82 y.o. male who presents with 2 days urinary retention and progressive confusion. Patient was just seen in ED 24 hours for pill esophagitis which resolved spontaneously. Patient has a history of prostate cancer. On evaluation in the ED today he was found to have sodium of 115. His sodium yesterday was 120, it is unclear if he is having symptoms when he was seen here in the ED or not. Either way his family member at bedside states that his symptoms got significantly worse today, and he is lethargic and incoherent with his speech. Foley was placed in the ED with large-volume return of dilute bloody urine. Pt with presentation to ED on March 17, 2016 with report of chemotherapy pill getting stuck in his throat. Alexander Green is a 82 y.o. male was recently started on xTandi medication. He states that the very large polys having trouble swallowing  it, yesterday folic he got stuck but then went down again this morning. At this time he has no complaints. He states he has a history of acid indigestion 80s taking antacids. He has a history of "burping" and sometimes he burps. However the reason he is here today is that when he swallowed his pills this morning and last night felt like it got "hung up" for a few seconds. He is taking them with water. They show me a pill that he is supposed be taking, it is very large. He has been having trouble with it since they started. At the time of discharge on 03/17/2016, the patient didn't have any complaints was even able to drink with no difficulty. He has had no abdominal pain and he has no diarrhea. No chest pain or shortness of breath. He does not feel that the pill is still struck. Pt was to follow-up with GI but was reattempted on 03/19/2016 secondary to urinary retention. Currently, pt is more awake at times but does have periods of lethargy and reduced responsiveness when his Sodium level fluctuate. Pt has been unable to have the GI Barium study completed in the past 2 days. MD stated to resume an oral diet.       SLP Plan  Continue with current plan of care     Recommendations  Diet recommendations: Dysphagia 1 (puree);Honey-thick liquid Liquids provided via: Teaspoon;Cup;No straw Medication Administration: Crushed with puree Supervision: Staff to assist with self feeding;Full supervision/cueing for compensatory strategies Compensations: Minimize environmental distractions;Small sips/bites;Slow rate;Lingual sweep for clearance of pocketing;Multiple dry swallows after each bite/sip;Follow solids with liquid Postural Changes and/or Swallow Maneuvers: Seated  upright 90 degrees;Upright 30-60 min after meal             General recommendations:  (Detician) Oral Care Recommendations: Oral care BID;Staff/trained caregiver to provide oral care Follow up Recommendations: Skilled Nursing facility (TBD) Plan:  Continue with current plan of care     [place], [place], CCC-SLP  Watson,Katherine 03/23/2016, 10:48 AM

## 2016-03-23 NOTE — Care Management Note (Signed)
Case Management Note  Patient Details  Name: Alexander Green MRN: [id number] Date of Birth: 1929/03/30  Subjective/Objective:         Patient admitted with urinary retention and hyponatremia.  Patient lives at home with wife.  Wife at bedside and provided history.  Daughter lives in [place].  Son is in town he lives in [place].  Patient has walker, shower chair, elevated toilet seat, and bars in the bathroom.     Prior to admission patient ambulated with a walker, and was able to preform task such as brush his teeth, and shave.  Patient did need assistance with other ADL's.  Patient has had a decline in mentation and function. There was slight improvement yesterday, and at that time PT recommended home health PT and [facility] was selected. However today patient has declined and palliative was consulted.          Action/Plan: RNCM following for discharge planning.   Expected Discharge Date:  03/20/16               Expected Discharge Plan:     In-House Referral:     Discharge planning Services     Post Acute Care Choice:    Choice offered to:     DME Arranged:    DME Agency:     HH Arranged:    HH Agency:     Status of Service:     Medicare Important Message Given:  Yes Date Medicare IM Given:    Medicare IM give by:    Date Additional Medicare IM Given:    Additional Medicare Important Message give by:     If discussed at [place] of Stay Meetings, dates discussed:    Additional Comments:  Beverly Sessions, RN 03/23/2016, 4:04 PM

## 2016-03-24 LAB — BASIC METABOLIC PANEL
Anion gap: 12 (ref 5–15)
BUN: 15 mg/dL (ref 6–20)
CALCIUM: 8.6 mg/dL — AB (ref 8.9–10.3)
CHLORIDE: 102 mmol/L (ref 101–111)
CO2: 19 mmol/L — ABNORMAL LOW (ref 22–32)
CREATININE: 0.58 mg/dL — AB (ref 0.61–1.24)
Glucose, Bld: 118 mg/dL — ABNORMAL HIGH (ref 65–99)
Potassium: 3 mmol/L — ABNORMAL LOW (ref 3.5–5.1)
SODIUM: 133 mmol/L — AB (ref 135–145)

## 2016-03-24 LAB — CBC
HCT: 33.9 % — ABNORMAL LOW (ref 40.0–52.0)
Hemoglobin: 12.1 g/dL — ABNORMAL LOW (ref 13.0–18.0)
MCH: 29.9 pg (ref 26.0–34.0)
MCHC: 35.6 g/dL (ref 32.0–36.0)
MCV: 84.1 fL (ref 80.0–100.0)
PLATELETS: 137 10*3/uL — AB (ref 150–440)
RBC: 4.03 MIL/uL — ABNORMAL LOW (ref 4.40–5.90)
RDW: 16.3 % — AB (ref 11.5–14.5)
WBC: 4.8 10*3/uL (ref 3.8–10.6)

## 2016-03-24 LAB — PSA: PSA: 43.48 ng/mL — AB (ref 0.00–4.00)

## 2016-03-24 MED ORDER — GLYCOPYRROLATE 1 MG PO TABS
1.0000 mg | ORAL_TABLET | ORAL | Status: DC | PRN
  Filled 2016-03-24: qty 1

## 2016-03-24 MED ORDER — ONDANSETRON HCL 4 MG/2ML IJ SOLN: 4.0000 mg | Freq: Four times a day (QID) | INTRAMUSCULAR | Status: DC | PRN

## 2016-03-24 MED ORDER — LORAZEPAM 2 MG/ML PO CONC: 1.0000 mg | ORAL | Status: DC | PRN

## 2016-03-24 MED ORDER — DEXTROSE 5 % IV SOLN
1.0000 g | INTRAVENOUS | Status: DC
  Administered 2016-03-24: 1 g via INTRAVENOUS
  Filled 2016-03-24 (×2): qty 10

## 2016-03-24 MED ORDER — HALOPERIDOL LACTATE 2 MG/ML PO CONC
0.5000 mg | ORAL | Status: DC | PRN
  Administered 2016-03-24: 0.5 mg via SUBLINGUAL
  Filled 2016-03-24 (×2): qty 0.3

## 2016-03-24 MED ORDER — POTASSIUM CHLORIDE 10 MEQ/100ML IV SOLN
10.0000 meq | INTRAVENOUS | Status: AC
  Administered 2016-03-24 (×4): 10 meq via INTRAVENOUS
  Filled 2016-03-24 (×4): qty 100

## 2016-03-24 MED ORDER — MORPHINE SULFATE (PF) 2 MG/ML IV SOLN
1.0000 mg | Freq: Once | INTRAVENOUS | Status: AC
  Administered 2016-03-24: 1 mg via INTRAVENOUS
  Filled 2016-03-24: qty 1

## 2016-03-24 MED ORDER — LORAZEPAM 1 MG PO TABS: 1.0000 mg | ORAL_TABLET | ORAL | Status: DC | PRN

## 2016-03-24 MED ORDER — HALOPERIDOL 0.5 MG PO TABS
0.5000 mg | ORAL_TABLET | ORAL | Status: DC | PRN
  Filled 2016-03-24: qty 1

## 2016-03-24 MED ORDER — GLYCOPYRROLATE 0.2 MG/ML IJ SOLN
0.2000 mg | INTRAMUSCULAR | Status: DC | PRN
  Administered 2016-03-24: 0.2 mg via INTRAVENOUS
  Filled 2016-03-24 (×2): qty 1

## 2016-03-24 MED ORDER — MORPHINE SULFATE (PF) 2 MG/ML IV SOLN
1.0000 mg | INTRAVENOUS | Status: DC | PRN
Start: 2016-03-24 — End: 2016-03-25
  Administered 2016-03-24 – 2016-03-25 (×3): 1 mg via INTRAVENOUS
  Filled 2016-03-24 (×3): qty 1

## 2016-03-24 MED ORDER — HALOPERIDOL LACTATE 5 MG/ML IJ SOLN: 0.5000 mg | INTRAMUSCULAR | Status: DC | PRN

## 2016-03-24 MED ORDER — LORAZEPAM 2 MG/ML IJ SOLN: 1.0000 mg | INTRAMUSCULAR | Status: DC | PRN

## 2016-03-24 MED ORDER — IPRATROPIUM-ALBUTEROL 0.5-2.5 (3) MG/3ML IN SOLN
3.0000 mL | RESPIRATORY_TRACT | Status: DC | PRN
  Administered 2016-03-24: 3 mL via RESPIRATORY_TRACT
  Filled 2016-03-24: qty 3

## 2016-03-24 MED ORDER — ONDANSETRON 8 MG PO TBDP: 4.0000 mg | ORAL_TABLET | Freq: Four times a day (QID) | ORAL | Status: DC | PRN

## 2016-03-24 MED ORDER — GLYCOPYRROLATE 0.2 MG/ML IJ SOLN
0.2000 mg | INTRAMUSCULAR | Status: DC | PRN
  Filled 2016-03-24: qty 1

## 2016-03-24 NOTE — Consult Note (Signed)
   [facility] CM Inpatient Consult   03/24/2016  Alexander Green 1929-05-23 [id number]   Patient screened for potential [place] Management services. Patient is eligible for [place]. Electronic medical record reveals patient's patient is transitioning to comfort measures only and there were no identifiable [facility] care management needs at this time. [facility] Care Management services not appropriate at this time. If patient's post hospital needs change please place a [facility] Care Management consult. For questions please contact:   Sapphire Tygart RN, [facility] Liaison  [phone]) [employer] [phone]) Toll free office

## 2016-03-24 NOTE — Progress Notes (Signed)
Family called me to patient's room stating that the patient "could not breathe". Upon entering room, patient sitting up with mouth open and audible wheezing present. BP 152/109 mmHg  Pulse 95  Temp(Src) 99.7 F (37.6 C) (Axillary)  Resp 22  Ht 6\' 5"  (1.956 m)  Wt 77.565 kg (171 lb)  BMI 20.27 kg/m2  SpO2 96% on room air. Dr. Lavetta Nielsen notified of patient wheezing and period of apnea. Q 4 hr PRN duoneb breathing treatment ordered by MD. Breathing treatment administered. Patient tolerated well. [name] grand-daughter is Therapist, sports and contacted me via phone stating that the patient should be considered limited code based off of what the family has expressed to her. Family then called me to room again stating he was not breathing. Period of apnea lasting approximately 20 seconds noted. Oral suctioning performed and moderate amount of thick, yellow sputum removed from airway. Dr. Lavetta Nielsen notified of patient's condition and code status concerns. Dr. Lavetta Nielsen arrived into patient room to assess patient and  discuss plan of care and code status with family.

## 2016-03-24 NOTE — Care Management Important Message (Signed)
Important Message  Patient Details  Name: Alexander Green MRN: [id number] Date of Birth: Oct 15, 1929   Medicare Important Message Given:  Yes    Beverly Sessions, RN 03/24/2016, 10:26 AM

## 2016-03-24 NOTE — Care Management (Signed)
Patient transitioning to comfort measures

## 2016-03-24 NOTE — Progress Notes (Signed)
Speech Therapy Note: reviewed chart notes; consulted NSG and MD who was called to the room d/t respiratory distress this PM. After MD met w/ family in room, discussion with family resulted in request for comfort measures only; prn meds ordered for pt's comfort and breathing per NSG. Pt does have an active diet order for Dys. 1 w/ Honey consistency liquids but pt has not been taking po's today d/t declined status and trays are being held; NSG to monitor. ST services will sign off at this time. NSG to reconsult if needs change. NSG agreed.

## 2016-03-24 NOTE — Progress Notes (Signed)
[place] at [place] NAME: Alexander Green    MRN#:  [id number]  DATE OF BIRTH:  [birth year]  SUBJECTIVE:  Hospital Day: 5 days Alexander Green is a 82 y.o. male presenting with Urinary Retention .   Called to bedside - respiratory distress  REVIEW OF SYSTEMS:  Unable to obtain  DRUG ALLERGIES:   Allergies  Allergen Reactions  . Celecoxib Rash    VITALS:  Blood pressure 152/109, pulse 95, temperature 99.7 F (37.6 C), temperature source Axillary, resp. rate 22, height 6\' 5"  (1.956 m), weight 171 lb (77.565 kg), SpO2 96 %.  PHYSICAL EXAMINATION:   VITAL SIGNS: Filed Vitals:   03/24/16 1423 03/24/16 1438  BP: 164/102 152/109  Pulse: 92 95  Temp:  99.7 F (37.6 C)  Resp:     GENERAL:82 y.o.male severe distress   HEAD: Normocephalic, atraumatic.  EYES: Pupils equal, round, reactive to light. Unable to assess extraocular muscles given mental status/medical condition. No scleral icterus.  MOUTH: dry mucosal membrane. Dentition intact. No abscess noted. Thick secretions visible EAR, NOSE, THROAT: Clear without exudates. No external lesions.  NECK: Supple. No thyromegaly. No nodules. No JVD. poo PULMONARY: coarse, without wheeze rails or rhonci. Tachypnea with use of accessory muscles, poorrespiratory effort. poorair entry bilaterally CHEST: Nontender to palpation.  CARDIOVASCULAR: S1 and S2. tachy. No murmurs, rubs, or gallops. No edema. Pedal pulses 2+ bilaterally.  GASTROINTESTINAL: Soft, nontender, nondistended. No masses. Positive bowel sounds. No hepatosplenomegaly.  MUSCULOSKELETAL: No swelling, clubbing, or edema. Range of motion full in all extremities.  NEUROLOGIC: Unable to assess given mental status/medical condition SKIN: No ulceration, lesions, rashes, or cyanosis. Skin warm and dry. Turgor intact.  PSYCHIATRIC: Unable to assess given mental status/medical condition       LABORATORY PANEL:   CBC  Recent  Labs Lab 03/24/16 0444  WBC 4.8  HGB 12.1*  HCT 33.9*  PLT 137*   ------------------------------------------------------------------------------------------------------------------  Chemistries   Recent Labs Lab 03/19/16 0029  03/24/16 0444  NA 115*  < > 133*  K 4.0  < > 3.0*  CL 86*  < > 102  CO2 17*  < > 19*  GLUCOSE 131*  < > 118*  BUN 11  < > 15  CREATININE 0.76  < > 0.58*  CALCIUM 8.8*  < > 8.6*  AST 33  --   --   ALT 14*  --   --   ALKPHOS 58  --   --   BILITOT 1.4*  --   --   < > = values in this interval not displayed. ------------------------------------------------------------------------------------------------------------------  Cardiac Enzymes  Recent Labs Lab 03/19/16 0029  TROPONINI <0.03   ------------------------------------------------------------------------------------------------------------------  RADIOLOGY:  Ct Head Wo Contrast  03/23/2016  CLINICAL DATA:  82 year old male with confusion EXAM: CT HEAD WITHOUT CONTRAST TECHNIQUE: Contiguous axial images were obtained from the base of the skull through the vertex without intravenous contrast. COMPARISON:  CT dated 03/19/2016 FINDINGS: There is mild to moderate age-related atrophy and chronic microvascular ischemic changes. There is no acute intracranial hemorrhage. No mass effect or midline shift noted the visualized paranasal sinuses and mastoid air cells are clear. The calvarium is intact. IMPRESSION: No acute intracranial hemorrhage. Age-related atrophy and chronic microvascular ischemic disease. If symptoms persist and there are no contraindications, MRI may provide better evaluation if clinically indicated. Electronically Signed   By: Anner Crete M.D.   On: 03/23/2016 20:20   Dg Esophagus  03/22/2016  CLINICAL  DATA:  Dysphagia, difficulty swallowing EXAM: ESOPHOGRAM/BARIUM SWALLOW TECHNIQUE: Single contrast examination was performed using  thin barium. FLUOROSCOPY TIME:  Radiation Exposure  Index (as provided by the fluoroscopic device): 0.8 mGy COMPARISON:  None. FINDINGS: The patient was placed in a semi upright position. The patient was asked to drink thin barium. The patient is unable to drink thin barium follow multiple attempts. There is overall difficulty initiating an oral phase. IMPRESSION: Aborted esophagram. Recommend evaluation with cine with speech pathology. Electronically Signed   By: Kathreen Devoid   On: 03/22/2016 16:05    EKG:   Orders placed or performed during the hospital encounter of 03/17/16  . ED EKG within 10 minutes  . ED EKG within 10 minutes  . EKG    ASSESSMENT AND PLAN:   Alexander Green is a 82 y.o. male presenting with Urinary Retention . Admitted 03/18/2016 : Day #: 5 days    Respiratory distress with hypoxia and apnea Thick secretions suctioned   After discussion with family - comfort measures only Prn meds ordered    All the records are reviewed and case discussed with Care Management/Social Workerr. Management plans discussed with the patient, family and they are in agreement.  CODE STATUS: dnr TOTAL TIME TAKING CARE OF THIS PATIENT: 60 minutes.   POSSIBLE D/C IN 1DAYS, DEPENDING ON CLINICAL CONDITION.   Hower,  Karenann Cai.D on 03/24/2016 at 3:21 PM  Between 7am to 6pm - Pager - [phone]  After 6pm: House Pager: - [phone]  [facility]  Office  [phone]  CC: Primary care physician; Otilio Miu, MD

## 2016-03-24 NOTE — Progress Notes (Signed)
[place] at [place] NAME: Alexander Green    MRN#:  [id number]  DATE OF BIRTH:  [birth year]  SUBJECTIVE:  Hospital Day: 5 days Alexander Green is a 82 y.o. male presenting with Urinary Retention .   Overnight events: No overnight events Interval Events: Decline in mental status today wife  present at bedside  REVIEW OF SYSTEMS:  Unable to obtain given patient's mental status  DRUG ALLERGIES:   Allergies  Allergen Reactions  . Celecoxib Rash    VITALS:  Blood pressure 153/77, pulse 77, temperature 97.7 F (36.5 C), temperature source Oral, resp. rate 22, height 6\' 5"  (1.956 m), weight 171 lb (77.565 kg), SpO2 99 %.  PHYSICAL EXAMINATION:   VITAL SIGNS: Filed Vitals:   03/23/16 2020 03/24/16 0411  BP: 128/80 153/77  Pulse: 86 77  Temp: 97.7 F (36.5 C) 97.7 F (36.5 C)  Resp: 24 22   GENERAL:82 y.o.male moderate distress given mental status.  HEAD: Normocephalic, atraumatic.  EYES: Pupils equal, round, reactive to light. Unable to assess extraocular muscles given mental status/medical condition. No scleral icterus.  MOUTH: Moist mucosal membrane. Dentition intact. No abscess noted.  EAR, NOSE, THROAT: Clear without exudates. No external lesions.  NECK: Supple. No thyromegaly. No nodules. No JVD.  PULMONARY: Clear to ascultation, without wheeze rails or rhonci. No use of accessory muscles, Good respiratory effort. good air entry bilaterally CHEST: Nontender to palpation.  CARDIOVASCULAR: S1 and S2. Regular rate and rhythm. No murmurs, rubs, or gallops. No edema. Pedal pulses 2+ bilaterally.  GASTROINTESTINAL: Soft, nontender, nondistended. No masses. Positive bowel sounds. No hepatosplenomegaly.  MUSCULOSKELETAL: No swelling, clubbing, or edema. Range of motion full in all extremities.  NEUROLOGIC: Unable to assess given mental status/medical condition SKIN: No ulceration, lesions, rashes, or cyanosis. Skin warm and dry.  Turgor intact.  PSYCHIATRIC: Unable to assess given mental status/medical condition        LABORATORY PANEL:   CBC  Recent Labs Lab 03/24/16 0444  WBC 4.8  HGB 12.1*  HCT 33.9*  PLT 137*   ------------------------------------------------------------------------------------------------------------------  Chemistries   Recent Labs Lab 03/19/16 0029  03/24/16 0444  NA 115*  < > 133*  K 4.0  < > 3.0*  CL 86*  < > 102  CO2 17*  < > 19*  GLUCOSE 131*  < > 118*  BUN 11  < > 15  CREATININE 0.76  < > 0.58*  CALCIUM 8.8*  < > 8.6*  AST 33  --   --   ALT 14*  --   --   ALKPHOS 58  --   --   BILITOT 1.4*  --   --   < > = values in this interval not displayed. ------------------------------------------------------------------------------------------------------------------  Cardiac Enzymes  Recent Labs Lab 03/19/16 0029  TROPONINI <0.03   ------------------------------------------------------------------------------------------------------------------  RADIOLOGY:  Ct Head Wo Contrast  03/23/2016  CLINICAL DATA:  82 year old male with confusion EXAM: CT HEAD WITHOUT CONTRAST TECHNIQUE: Contiguous axial images were obtained from the base of the skull through the vertex without intravenous contrast. COMPARISON:  CT dated 03/19/2016 FINDINGS: There is mild to moderate age-related atrophy and chronic microvascular ischemic changes. There is no acute intracranial hemorrhage. No mass effect or midline shift noted the visualized paranasal sinuses and mastoid air cells are clear. The calvarium is intact. IMPRESSION: No acute intracranial hemorrhage. Age-related atrophy and chronic microvascular ischemic disease. If symptoms persist and there are no contraindications, MRI may provide better evaluation  if clinically indicated. Electronically Signed   By: Anner Crete M.D.   On: 03/23/2016 20:20   Dg Esophagus  03/22/2016  CLINICAL DATA:  Dysphagia, difficulty swallowing EXAM:  ESOPHOGRAM/BARIUM SWALLOW TECHNIQUE: Single contrast examination was performed using  thin barium. FLUOROSCOPY TIME:  Radiation Exposure Index (as provided by the fluoroscopic device): 0.8 mGy COMPARISON:  None. FINDINGS: The patient was placed in a semi upright position. The patient was asked to drink thin barium. The patient is unable to drink thin barium follow multiple attempts. There is overall difficulty initiating an oral phase. IMPRESSION: Aborted esophagram. Recommend evaluation with cine with speech pathology. Electronically Signed   By: Kathreen Devoid   On: 03/22/2016 16:05    EKG:   Orders placed or performed during the hospital encounter of 03/17/16  . ED EKG within 10 minutes  . ED EKG within 10 minutes  . EKG    ASSESSMENT AND PLAN:   Alexander Green is a 82 y.o. male presenting with Urinary Retention . Admitted 03/18/2016 : Day #: 5 days 1. Urinary retention in setting of prostate cancer: Foley catheter in place urology Input appreciated continue Flomax 2. Urinary tract infection: CAUTI: Ceftriaxone, follow culture data 3. Hyponatremia: Nephrology input appreciated continue current course recheck tomorrow slowly improving 4. Metabolic encephalopathy: Mental status seems to relapse today more confused and lethargic-consultative care 5. Prostate cancer: Hold oral cancer agents patient recently had issues with swallowing pills-instructed to stop this medicine by oncologist 6. Dysphagia: Dysphagia diet 7. GERD without esophagitis PPI therapy  Disposition: Given lack of general progression have a lengthy discussion with the wife and patient's son and daughter in regards to goals of care. Approach the issues of dysphagia, feeding tubes versus feeding for comfort and comfort measures only. I do believe this family are in favor of comfort measures but not exactly ready to make that decision. Will consult palliative care to aid in continuing this conversation   All the records are reviewed  and case discussed with Care Management/Social Workerr. Management plans discussed with the patient, family and they are in agreement.  CODE STATUS: full TOTAL TIME TAKING CARE OF THIS PATIENT: 33 minutes.   POSSIBLE D/C IN 2-3DAYS, DEPENDING ON CLINICAL CONDITION.   Hower,  Karenann Cai.D on 03/24/2016 at 1:56 PM  Between 7am to 6pm - Pager - [phone]  After 6pm: House Pager: - [facility]  Office  [phone]  CC: Primary care physician; Otilio Miu, MD

## 2016-03-24 NOTE — Progress Notes (Signed)
Subjective:  Patient is doing very poorly today [name] family is at bedside along with son and daughter-in-law. Obtunded.  Not able to follow commands or answering questions Sodium level improved to 133 today Potassium is however, low at 3.0   Objective:  Vital signs in last 24 hours:  Temp:  [97.6 F (36.4 C)-99.7 F (37.6 C)] 99.7 F (37.6 C) (06/09 1438) Pulse Rate:  [77-100] 95 (06/09 1438) Resp:  [22-28] 22 (06/09 0411) BP: (128-164)/(77-109) 152/109 mmHg (06/09 1438) SpO2:  [92 %-99 %] 96 % (06/09 1438) Weight:  [77.565 kg (171 lb)] 77.565 kg (171 lb) (06/09 0501)  Weight change:  Filed Weights   03/18/16 2240 03/24/16 0501  Weight: 72.576 kg (160 lb) 77.565 kg (171 lb)    Intake/Output:    Intake/Output Summary (Last 24 hours) at 03/24/16 1454 Last data filed at 03/24/16 1300  Gross per 24 hour  Intake 1304.5 ml  Output    750 ml  Net  554.5 ml     Physical Exam: General: No acute distress laying in the bed  HEENT Moist oral mucous membranes  Neck supple  Pulm/lungs Shallow breaths but clear anteriorly and laterally  CVS/Heart irregular, no rub or gallop  Abdomen:  Soft, nontender, nondistended  Extremities: No peripheral edema  Neurologic: not following commands today  Skin: No acute rashes  Foley present       Basic Metabolic Panel:   Recent Labs Lab 03/21/16 0446 03/21/16 1353 03/22/16 0032 03/23/16 1032 03/24/16 0444  NA 125* 125* 126* 128* 133*  K 3.4* 3.3* 3.5 3.0* 3.0*  CL 96* 96* 97* 99* 102  CO2 20* 21* 21* 18* 19*  GLUCOSE 109* 110* 107* 102* 118*  BUN 10 9 9 10 15   CREATININE 0.50* 0.56* 0.64 0.64 0.58*  CALCIUM 8.4* 8.2* 8.5* 8.6* 8.6*     CBC:  Recent Labs Lab 03/19/16 0029 03/21/16 0446 03/24/16 0444  WBC 8.6 5.4 4.8  HGB 12.3* 11.3* 12.1*  HCT 34.5* 31.8* 33.9*  MCV 82.0 82.7 84.1  PLT 137* 113* 137*      Microbiology:  No results found for this or any previous visit (from the past 720  hour(s)).  Coagulation Studies: No results for input(s): LABPROT, INR in the last 72 hours.  Urinalysis:  Recent Labs  03/23/16 1939  COLORURINE YELLOW*  LABSPEC 1.015  PHURINE 6.0  GLUCOSEU NEGATIVE  HGBUR 3+*  BILIRUBINUR NEGATIVE  KETONESUR 2+*  PROTEINUR 100*  NITRITE NEGATIVE  LEUKOCYTESUR 2+*      Imaging: Ct Head Wo Contrast  03/23/2016  CLINICAL DATA:  82 year old male with confusion EXAM: CT HEAD WITHOUT CONTRAST TECHNIQUE: Contiguous axial images were obtained from the base of the skull through the vertex without intravenous contrast. COMPARISON:  CT dated 03/19/2016 FINDINGS: There is mild to moderate age-related atrophy and chronic microvascular ischemic changes. There is no acute intracranial hemorrhage. No mass effect or midline shift noted the visualized paranasal sinuses and mastoid air cells are clear. The calvarium is intact. IMPRESSION: No acute intracranial hemorrhage. Age-related atrophy and chronic microvascular ischemic disease. If symptoms persist and there are no contraindications, MRI may provide better evaluation if clinically indicated. Electronically Signed   By: Anner Crete M.D.   On: 03/23/2016 20:20   Dg Esophagus  03/22/2016  CLINICAL DATA:  Dysphagia, difficulty swallowing EXAM: ESOPHOGRAM/BARIUM SWALLOW TECHNIQUE: Single contrast examination was performed using  thin barium. FLUOROSCOPY TIME:  Radiation Exposure Index (as provided by the fluoroscopic device): 0.8 mGy COMPARISON:  None. FINDINGS: The patient was placed in a semi upright position. The patient was asked to drink thin barium. The patient is unable to drink thin barium follow multiple attempts. There is overall difficulty initiating an oral phase. IMPRESSION: Aborted esophagram. Recommend evaluation with cine with speech pathology. Electronically Signed   By: Kathreen Devoid   On: 03/22/2016 16:05     Medications:   . 0.9 % NaCl with KCl 20 mEq / L 50 mL/hr at 03/24/16 1012   .  aspirin  81 mg Oral Daily  . cefTRIAXone (ROCEPHIN)  IV  1 g Intravenous Q24H  . clopidogrel  75 mg Oral Daily  . enoxaparin (LOVENOX) injection  40 mg Subcutaneous Q24H  . furosemide  20 mg Oral Daily  . metoprolol succinate  50 mg Oral Daily  . nitroGLYCERIN  0.1 mg Transdermal Daily  . pantoprazole sodium  40 mg Per Tube Daily  . simvastatin  40 mg Oral q1800  . tamsulosin  0.4 mg Oral Daily   acetaminophen **OR** acetaminophen, haloperidol lactate, ipratropium-albuterol, ondansetron **OR** ondansetron (ZOFRAN) IV  Assessment/ Plan:  82 y.o. male with medical problems of castrate resistant prostate cancer, Lung nodule, hip fracture 2015, knee replacement 2009, coronary disease 2000, esophageal stricture 2015, who was admitted to [facility] on 03/18/2016 for evaluation of 2 days of urinary retention and progressive confusion.  1. Hyponatremia - Likely multifactorial. [name] oral intake is very poor therefore he probably has a component of volume depletion. It should have been corrected with IV saline that he received. 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 His oral intake mostly consists of low solute diet Urine osmolality is 254, specific gravity 1.008 suggesting dilute urine  Plan: continue using very low dose Lasix in combination with saline   2. Hypokalemia Would replace with potassium chloride IV    LOS: 5 Shantella Blubaugh 6/9/20172:54 PM

## 2016-03-24 NOTE — Progress Notes (Signed)
Nutrition Brief Note  Chart reviewed. Pt now transitioning to comfort measures only.  No further nutrition interventions warranted at this time. Will sign off.  Please re-consult as needed.   Alexander Green, [place], LDN [phone] Pager  [phone] Weekend/On-Call Pager

## 2016-03-24 NOTE — Progress Notes (Signed)
PT Cancellation Note  Patient Details Name: Alexander Green MRN: [id number] DOB: 06-08-1929   Cancelled Treatment:    Reason Eval/Treat Not Completed: Other (comment). Pt with decline in status and family considering comfort care measures. Will hold therapy at this time until family reaches decision about goals of care.   [name] 03/24/2016, 2:07 PM Greggory Stallion, PT, DPT [phone]

## 2016-03-25 MED ORDER — SODIUM CHLORIDE 0.9 % IV SOLN: 2.0000 mg/h | INTRAVENOUS | Status: DC

## 2016-03-25 MED ORDER — MORPHINE SULFATE (PF) 2 MG/ML IV SOLN
2.0000 mg | INTRAVENOUS | Status: DC | PRN
  Administered 2016-03-25: 2 mg via INTRAVENOUS
  Filled 2016-03-25: qty 1

## 2016-03-25 MED ORDER — MORPHINE 100MG IN NS 100ML (1MG/ML) PREMIX INFUSION
2.0000 mg/h | INTRAVENOUS | Status: DC
  Administered 2016-03-25 – 2016-03-26 (×2): 2 mg/h via INTRAVENOUS
  Filled 2016-03-25 (×2): qty 100

## 2016-03-25 NOTE — Progress Notes (Signed)
Patient ID: Alexander Green, male   DOB: October 15, 1929, 82 y.o.   MRN: [id number] [facility] PROGRESS NOTE  Alexander Green [id number] DOB: 1929-06-27 DOA: 03/18/2016 PCP: Alexander Miu, MD  HPI/Subjective: Patient opens eyes to stimulation. Unable to talk.  Objective: Filed Vitals:   03/24/16 2042 03/25/16 0556  BP: 108/67 112/57  Pulse: 108 66  Temp: 99.9 F (37.7 C) 98.7 F (37.1 C)  Resp: 19 19    Filed Weights   03/18/16 2240 03/24/16 0501  Weight: 72.576 kg (160 lb) 77.565 kg (171 lb)    ROS: Review of Systems  Unable to perform ROS unresponsive Exam: Physical Exam  Constitutional: He appears lethargic.  HENT:  Nose: No mucosal edema.  Mouth/Throat: No oropharyngeal exudate or posterior oropharyngeal edema.  Very dry, crusting back of mouth  Eyes: Conjunctivae and lids are normal. Pupils are equal, round, and reactive to light.  Neck: No JVD present. Carotid bruit is not present. No edema present. No thyroid mass and no thyromegaly present.  Cardiovascular: S1 normal and S2 normal.  Exam reveals no gallop.   No murmur heard. Pulses:      Dorsalis pedis pulses are 2+ on the right side, and 2+ on the left side.  Respiratory: No respiratory distress. He has no wheezes. He has rhonchi in the right lower field and the left lower field. He has no rales.  GI: Soft. Bowel sounds are normal. There is no tenderness.  Musculoskeletal:       Right ankle: He exhibits swelling.       Left ankle: He exhibits swelling.  Lymphadenopathy:    He has no cervical adenopathy.  Neurological: He appears lethargic.  Skin: Skin is warm. No rash noted. Nails show no clubbing.  Psychiatric:  Opens eyes to stimulation      Data Reviewed: Basic Metabolic Panel:  Recent Labs Lab 03/21/16 0446 03/21/16 1353 03/22/16 0032 03/23/16 1032 03/24/16 0444  NA 125* 125* 126* 128* 133*  K 3.4* 3.3* 3.5 3.0* 3.0*  CL 96* 96* 97* 99* 102  CO2 20* 21* 21* 18* 19*   GLUCOSE 109* 110* 107* 102* 118*  BUN 10 9 9 10 15   CREATININE 0.50* 0.56* 0.64 0.64 0.58*  CALCIUM 8.4* 8.2* 8.5* 8.6* 8.6*   Liver Function Tests:  Recent Labs Lab 03/19/16 0029  AST 33  ALT 14*  ALKPHOS 58  BILITOT 1.4*  PROT 6.1*  ALBUMIN 4.0   CBC:  Recent Labs Lab 03/19/16 0029 03/21/16 0446 03/24/16 0444  WBC 8.6 5.4 4.8  HGB 12.3* 11.3* 12.1*  HCT 34.5* 31.8* 33.9*  MCV 82.0 82.7 84.1  PLT 137* 113* 137*   Cardiac Enzymes:  CBG:  Recent Labs Lab 03/23/16 1930  GLUCAP 117*      Studies: Ct Head Wo Contrast  03/23/2016  CLINICAL DATA:  82 year old male with confusion EXAM: CT HEAD WITHOUT CONTRAST TECHNIQUE: Contiguous axial images were obtained from the base of the skull through the vertex without intravenous contrast. COMPARISON:  CT dated 03/19/2016 FINDINGS: There is mild to moderate age-related atrophy and chronic microvascular ischemic changes. There is no acute intracranial hemorrhage. No mass effect or midline shift noted the visualized paranasal sinuses and mastoid air cells are clear. The calvarium is intact. IMPRESSION: No acute intracranial hemorrhage. Age-related atrophy and chronic microvascular ischemic disease. If symptoms persist and there are no contraindications, MRI may provide better evaluation if clinically indicated. Electronically Signed   By: Anner Crete M.D.   On: 03/23/2016  20:20    Scheduled Meds:  Continuous Infusions: . morphine      Assessment/Plan:  1. Comfort care measures for acute respiratory failure with periods of apnea.  Start morphine drip. Case discussed with family at bedside. Case discussed with nursing staff 2. Acute urinary retention requiring foley. Hx prostate cancer 3. UTI- stop abx 4. Hyponatremia 5. Hypokalemia 6. Metabolic encephalopathy 7. Dysphagia 8. gerd  Code Status:     Code Status Orders        Start     Ordered   03/24/16 1520  Do not attempt resuscitation (DNR)   Continuous     Question Answer Comment  In the event of cardiac or respiratory ARREST Do not call a "code blue"   In the event of cardiac or respiratory ARREST Do not perform Intubation, CPR, defibrillation or ACLS   In the event of cardiac or respiratory ARREST Use medication by any route, position, wound care, and other measures to relive pain and suffering. May use oxygen, suction and manual treatment of airway obstruction as needed for comfort.      03/24/16 1520    Code Status History    Date Active Date Inactive Code Status Order ID Comments User Context   03/24/2016  3:18 PM 03/24/2016  3:21 PM DNR [id number]  Lytle Butte, MD Inpatient   03/19/2016  2:56 AM 03/24/2016  3:18 PM Full Code [id number]  Lance Coon, MD Inpatient     Family Communication: Family at the bedside Disposition Plan: comfort care measures  Time spent: 54minutes  Camara Renstrom, [facility]

## 2016-03-25 NOTE — Progress Notes (Signed)
Pt family refused comfort care cart at this time. Stated may want coffee and bananas in the morning.

## 2016-03-26 LAB — URINE CULTURE

## 2016-03-26 NOTE — Progress Notes (Signed)
Patient ID: Alexander Green, male   DOB: Oct 02, 1929, 82 y.o.   MRN: [id number] [facility] PROGRESS NOTE  Kinnith Omdahl [id number] DOB: 02-27-1929 DOA: 03/18/2016 PCP: Otilio Miu, MD  HPI/Subjective: Patient opened eyes and was talking for a few minutes then fell back asleep. Patient difficult to understand. As per family, patient has been comfortable.  Objective: Filed Vitals:   03/25/16 2024 03/26/16 0519  BP: 98/61 111/69  Pulse: 146 148  Temp: 98.3 F (36.8 C) 99.4 F (37.4 C)  Resp: 16 16    Filed Weights   03/18/16 2240 03/24/16 0501  Weight: 72.576 kg (160 lb) 77.565 kg (171 lb)    ROS: Review of Systems  Unable to perform ROS acute encephalopathy Exam: Physical Exam  Constitutional: He appears lethargic.  HENT:  Nose: No mucosal edema.  Mouth/Throat: No oropharyngeal exudate or posterior oropharyngeal edema.  Very dry, crusting back of mouth  Eyes: Conjunctivae and lids are normal. Pupils are equal, round, and reactive to light.  Neck: No JVD present. Carotid bruit is not present. No edema present. No thyroid mass and no thyromegaly present.  Cardiovascular: S1 normal and S2 normal.  Exam reveals no gallop.   No murmur heard. Pulses:      Dorsalis pedis pulses are 2+ on the right side, and 2+ on the left side.  Respiratory: No respiratory distress. He has no wheezes. He has rhonchi in the right lower field and the left lower field. He has no rales.  GI: Soft. Bowel sounds are normal. There is no tenderness.  Musculoskeletal:       Right ankle: He exhibits swelling.       Left ankle: He exhibits swelling.  Lymphadenopathy:    He has no cervical adenopathy.  Neurological: He appears lethargic.  Skin: Skin is warm. No rash noted. Nails show no clubbing.  Psychiatric:  Opens eyes to stimulation      Data Reviewed: Basic Metabolic Panel:  Recent Labs Lab 03/21/16 0446 03/21/16 1353 03/22/16 0032 03/23/16 1032 03/24/16 0444  NA  125* 125* 126* 128* 133*  K 3.4* 3.3* 3.5 3.0* 3.0*  CL 96* 96* 97* 99* 102  CO2 20* 21* 21* 18* 19*  GLUCOSE 109* 110* 107* 102* 118*  BUN 10 9 9 10 15   CREATININE 0.50* 0.56* 0.64 0.64 0.58*  CALCIUM 8.4* 8.2* 8.5* 8.6* 8.6*   CBC:  Recent Labs Lab 03/21/16 0446 03/24/16 0444  WBC 5.4 4.8  HGB 11.3* 12.1*  HCT 31.8* 33.9*  MCV 82.7 84.1  PLT 113* 137*   Cardiac Enzymes:  CBG:  Recent Labs Lab 03/23/16 1930  GLUCAP 117*    Scheduled Meds:  Continuous Infusions: . morphine 2 mg/hr (03/26/16 0711)    Assessment/Plan:  1. Comfort care measures for acute respiratory failure with periods of apnea, acute encephalopathy.  Continue morphine drip. Family is okay with the same rate of drip at this point. Discussed with family at bedside. Case discussed with nursing staff. 2. Acute urinary retention requiring foley. Hx prostate cancer 3. UTI 4. Hyponatremia 5. Hypokalemia 6. Metabolic encephalopathy 7. Dysphagia 8. gerd  Code Status:     Code Status Orders        Start     Ordered   03/24/16 1520  Do not attempt resuscitation (DNR)   Continuous    Question Answer Comment  In the event of cardiac or respiratory ARREST Do not call a "code blue"   In the event of cardiac or respiratory  ARREST Do not perform Intubation, CPR, defibrillation or ACLS   In the event of cardiac or respiratory ARREST Use medication by any route, position, wound care, and other measures to relive pain and suffering. May use oxygen, suction and manual treatment of airway obstruction as needed for comfort.      03/24/16 1520    Code Status History    Date Active Date Inactive Code Status Order ID Comments User Context   03/24/2016  3:18 PM 03/24/2016  3:21 PM DNR [id number]  Lytle Butte, MD Inpatient   03/19/2016  2:56 AM 03/24/2016  3:18 PM Full Code [id number]  Lance Coon, MD Inpatient     Family Communication: Family at the bedside. Disposition Plan: comfort care measures  Time spent: 20  minutes  [place], [place]

## 2016-03-26 NOTE — Plan of Care (Signed)
Problem: Health Behavior/Discharge Planning: Goal: Ability to manage health-related needs will improve Outcome: Not Met (add Reason) Pt on comfort care  Problem: Skin Integrity: Goal: Risk for impaired skin integrity will decrease Outcome: Progressing Skin in tact; no s/s of breakdown  Problem: Activity: Goal: Risk for activity intolerance will decrease Outcome: Not Met (add Reason) Pt on comfort care  Problem: Nutrition: Goal: Adequate nutrition will be maintained Outcome: Not Met (add Reason) Pt on comfort care  Problem: Education: Goal: Knowledge of the prescribed therapeutic regimen will improve Outcome: Not Met (add Reason) Pt on comfort care  Problem: Coping: Goal: Ability to identify and develop effective coping behavior will improve Outcome: Not Met (add Reason) Pt on comfort care  Problem: Physical Regulation: Goal: Quality of life will improve Outcome: Progressing Pt resting comfortably; no s/s of pain; morphine drip infusing  Problem: Respiratory: Goal: Verbalizations of increased ease of respirations will increase Outcome: Not Progressing No verbalization; no observed complications of difficulty with respirations  Problem: Role Relationship: Goal: Ability to verbalize concerns, feelings, and thoughts to partner or family member will improve Outcome: Not Progressing Pt not verbalizing Goal: Ability to express an understanding of role expectations in relation to illness will improve Outcome: Not Progressing Pt not verbal  Problem: Pain Management: Goal: Satisfaction with pain management regimen will improve Outcome: Progressing No s/s of pain

## 2016-03-27 MED ORDER — LORAZEPAM 2 MG/ML PO CONC: 1.0000 mg | ORAL | Status: AC | PRN

## 2016-03-27 MED ORDER — GLYCOPYRROLATE 0.2 MG/ML IJ SOLN: 0.2000 mg | INTRAMUSCULAR | Status: AC | PRN

## 2016-03-27 MED ORDER — MORPHINE 100MG IN NS 100ML (1MG/ML) PREMIX INFUSION: 2.0000 mg/h | INTRAVENOUS | Status: AC

## 2016-03-27 NOTE — Progress Notes (Signed)
New hospice home referral received from [name] is an 82 year old gentleman admitted to [facility] on 6/3 for assessment of urinary retention, hyponatremia and acute encephalopathy. He required foley placement in the ED related to >700 cc present on bladder scan, received IV fluids for his low sodium as well as replacement for low potassium and was treated with IV antibiotics for a suspected UTI. He continued to decline despite these interventions and was made comfort care on 6/11. Family chose for him to discharge to the hospice home for comfort at end of life. Patient was started on a continuous morphine drip at 2 mg/hr for management of pain and dyspnea on 6/10, he has not, per chart note review,required any titration of that dose and remains at 2 mg/hr at time of visit. His PMH includes recurrent prostate ca, CAD, HTN, HLD, COPD, GERD, stroke,and neuromuscular disease. Writer met in the patient's room with his wife Alexander Green, daughter Alexander Green, his daughter in Sports coach and family Alexander Green . Education was initiated regarding hospice services, philosophy and team approach to care with good understanding voiced by all. Questions answered, consents signed. Patient did not respond to verbal or tactile stimulation, respiratory rate even unlabored. Respiratory rate increased with mouth care. Patient information faxed to referral, report called to the hospice home. EMS notified for transport at 2:45 pm. Hospital care team aware. Signed DNR in place in patient's discharge packet. Prescription for the continuous morphine drip faxed to the hospice home."Gone From My [facility] " booklet given, along with end of life education.  Thank you for the opportunity to be involved in the care of this patient. Flo Shanks RN, BSN, [place] [facility] [place], [facility] [phone] c

## 2016-03-27 NOTE — Progress Notes (Signed)
Patient ID: Alexander Green, male   DOB: 1929/05/15, 82 y.o.   MRN: [id number] [facility] PROGRESS NOTE  Chayten Hovanec [id number] DOB: 1928-11-16 DOA: 03/18/2016 PCP: Otilio Miu, MD  HPI/Subjective: Patient resisted me opening his eyes, grimaced with sternal rub.  Objective: Filed Vitals:   03/26/16 0519 03/26/16 2100  BP: 111/69 113/69  Pulse: 148 154  Temp: 99.4 F (37.4 C)   Resp: 16 16    Filed Weights   03/18/16 2240 03/24/16 0501  Weight: 72.576 kg (160 lb) 77.565 kg (171 lb)    ROS: Review of Systems  Unable to perform ROS acute encephalopathy Exam: Physical Exam  Constitutional: He appears lethargic.  HENT:  Nose: No mucosal edema.  Mouth/Throat: No oropharyngeal exudate or posterior oropharyngeal edema.  Very dry, crusting back of mouth  Eyes: Conjunctivae and lids are normal.  Pupils pinpoint  Neck: No JVD present. Carotid bruit is not present. No edema present. No thyroid mass and no thyromegaly present.  Cardiovascular: S1 normal and S2 normal.  Tachycardia present.  Exam reveals no gallop.   No murmur heard. Pulses:      Dorsalis pedis pulses are 2+ on the right side, and 2+ on the left side.  Respiratory: No respiratory distress. He has no wheezes. He has rhonchi in the right lower field and the left lower field. He has no rales.  GI: Soft. Bowel sounds are normal. There is no tenderness.  Musculoskeletal:       Right ankle: He exhibits swelling.       Left ankle: He exhibits swelling.  Lymphadenopathy:    He has no cervical adenopathy.  Neurological: He appears lethargic.  Grimaced with sternal rub  Skin: Skin is warm. No rash noted. Nails show no clubbing.  Psychiatric:  Grimaced with sternal rub      Data Reviewed: Basic Metabolic Panel:  Recent Labs Lab 03/21/16 0446 03/21/16 1353 03/22/16 0032 03/23/16 1032 03/24/16 0444  NA 125* 125* 126* 128* 133*  K 3.4* 3.3* 3.5 3.0* 3.0*  CL 96* 96* 97* 99* 102  CO2  20* 21* 21* 18* 19*  GLUCOSE 109* 110* 107* 102* 118*  BUN 10 9 9 10 15   CREATININE 0.50* 0.56* 0.64 0.64 0.58*  CALCIUM 8.4* 8.2* 8.5* 8.6* 8.6*   CBC:  Recent Labs Lab 03/21/16 0446 03/24/16 0444  WBC 5.4 4.8  HGB 11.3* 12.1*  HCT 31.8* 33.9*  MCV 82.7 84.1  PLT 113* 137*   Cardiac Enzymes:  CBG:  Recent Labs Lab 03/23/16 1930  GLUCAP 117*    Scheduled Meds:  Continuous Infusions: . morphine 2 mg/hr (03/26/16 0711)    Assessment/Plan:  1. Comfort care measures for acute respiratory failure with periods of apnea, acute encephalopathy.  Continue morphine drip. Discussed with family at bedside. Case discussed with nursing staff.  Hospice consult today. 2. Acute urinary retention requiring foley. Hx prostate cancer 3. UTI 4. Hyponatremia 5. Hypokalemia 6. Metabolic encephalopathy 7. Dysphagia 8. gerd  Code Status:     Code Status Orders        Start     Ordered   03/24/16 1520  Do not attempt resuscitation (DNR)   Continuous    Question Answer Comment  In the event of cardiac or respiratory ARREST Do not call a "code blue"   In the event of cardiac or respiratory ARREST Do not perform Intubation, CPR, defibrillation or ACLS   In the event of cardiac or respiratory ARREST Use medication by any  route, position, wound care, and other measures to relive pain and suffering. May use oxygen, suction and manual treatment of airway obstruction as needed for comfort.      03/24/16 1520    Code Status History    Date Active Date Inactive Code Status Order ID Comments User Context   03/24/2016  3:18 PM 03/24/2016  3:21 PM DNR [id number]  Lytle Butte, MD Inpatient   03/19/2016  2:56 AM 03/24/2016  3:18 PM Full Code [id number]  Lance Coon, MD Inpatient     Family Communication: Family at the bedside. Disposition Plan: comfort care measures  Time spent: 20 minutes  [place], [place]

## 2016-03-27 NOTE — Discharge Instructions (Signed)
Acute Urinary Retention, Male °Acute urinary retention is the temporary inability to urinate. °This is a common problem in older men. As men age their prostates become larger and block the flow of urine from the bladder. This is usually a problem that has come on gradually.  °HOME CARE INSTRUCTIONS °If you are sent home with a Foley catheter and a drainage system, you will need to discuss the best course of action with your health care provider. While the catheter is in, maintain a good intake of fluids. Keep the drainage bag emptied and lower than your catheter. This is so that contaminated urine will not flow back into your bladder, which could lead to a urinary tract infection. °There are two main types of drainage bags. One is a large bag that usually is used at night. It has a good capacity that will allow you to sleep through the night without having to empty it. The second type is called a leg bag. It has a smaller capacity, so it needs to be emptied more frequently. However, the main advantage is that it can be attached by a leg strap and can go underneath your clothing, allowing you the freedom to move about or leave your home. °Only take over-the-counter or prescription medicines for pain, discomfort, or fever as directed by your health care provider.  °SEEK MEDICAL CARE IF: °· You develop a low-grade fever. °· You experience spasms or leakage of urine with the spasms. °SEEK IMMEDIATE MEDICAL CARE IF:  °· You develop chills or fever. °· Your catheter stops draining urine. °· Your catheter falls out. °· You start to develop increased bleeding that does not respond to rest and increased fluid intake. °MAKE SURE YOU: °· Understand these instructions. °· Will watch your condition. °· Will get help right away if you are not doing well or get worse. °  °This information is not intended to replace advice given to you by your health care provider. Make sure you discuss any questions you have with your health care  provider. °  °Document Released: 01/08/2001 Document Revised: 02/16/2015 Document Reviewed: 03/13/2013 °Elsevier Interactive Patient Education ©2016 [employer] ° °

## 2016-03-27 NOTE — Discharge Summary (Signed)
[facility] [place] NAME: Alexander Green    MR#:  [id number]  DATE OF BIRTH:  [birth year]  DATE OF ADMISSION:  03/18/2016 ADMITTING PHYSICIAN: Lance Coon, MD  DATE OF DISCHARGE: 03/27/2016  PRIMARY CARE PHYSICIAN: Otilio Miu, MD    ADMISSION DIAGNOSIS:  Urinary retention [R33.9] Hyponatremia [E87.1] Disorientation [R41.0]  DISCHARGE DIAGNOSIS:  Principal Problem:   Urinary retention Active Problems:   Hyponatremia   GERD (gastroesophageal reflux disease)   HTN (hypertension)   HLD (hyperlipidemia)   COPD (chronic obstructive pulmonary disease) (HCC)   CAD (coronary artery disease)   Malnutrition of moderate degree   SECONDARY DIAGNOSIS:   Past Medical History  Diagnosis Date  . GERD (gastroesophageal reflux disease)   . Hypertension   . Hyperlipidemia   . B12 deficiency   . Stroke ([place])   . Neuromuscular disorder ([place])   . CAD (coronary artery disease)   . COPD (chronic obstructive pulmonary disease) ([place])   . Prostate cancer [facility]) 2009    Cryoablation with Dr. Earlean Polka COURSE:   1. Acute respiratory failure with periods of apnea. Patient was made comfort care. 2. Acute encephalopathy. Metabolic encephalopathy.  3. Acute urinary retention requiring Foley catheter with history of prostate cancer 4. Catheter related UTI. Antibiotics stopped 5. Hyponatremia 6. Hypokalemia 7. Dysphagia 8. GERD  The patient was admitted to the hospital with acute urinary retention requiring a Foley catheter placement. He did have good urine output after the Foley was placed. He is seen in consultation by urology and nephrology. He also had hyponatremia and normal saline was started. The patient did not progress and improve during the hospital course and was not eating. The family was interested in comfort care measures. I placed the patient on a morphine drip. Patient will be transferred to the hospice home.  DISCHARGE  CONDITIONS:   Guarded  CONSULTS OBTAINED:  Urology Nephrology  DRUG ALLERGIES:   Allergies  Allergen Reactions  . Celecoxib Rash    DISCHARGE MEDICATIONS:   Current Discharge Medication List    START taking these medications   Details  glycopyrrolate (ROBINUL) 0.2 MG/ML injection Inject 1 mL (0.2 mg total) into the vein every 4 (four) hours as needed (excessive secretions). Qty: 1 mL, Refills: 0    LORazepam (ATIVAN) 2 MG/ML concentrated solution Place 0.5 mLs (1 mg total) under the tongue every 4 (four) hours as needed for anxiety. Qty: 30 mL, Refills: 0    morphine 1 mg/mL SOLN infusion Inject 2 mg/hr into the vein continuous. Qty: 100 mL, Refills: 0      CONTINUE these medications which have NOT CHANGED   Details  TraMADol HCl 100 MG TB24 Take 100 mg by mouth daily as needed (pain).    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 ([place]); Anemia due to other cause      STOP taking these medications     ALPRAZolam (XANAX) 0.5 MG tablet      aspirin 81 MG tablet      Calcium Carbonate-Vitamin D (CALCIUM 600+D) 600-400 MG-UNIT tablet      cetirizine (ZYRTEC) 10 MG tablet      clopidogrel (PLAVIX) 75 MG tablet      Cyanocobalamin 1000 MCG/ML KIT      ferrous sulfate 325 (65 FE) MG tablet      ipratropium (ATROVENT) 0.06 % nasal spray      Leuprolide Acetate, 6 Month, (LUPRON DEPOT) 45 MG injection  metoprolol succinate (TOPROL-XL) 50 MG 24 hr tablet      nitroGLYCERIN (NITRODUR - DOSED IN MG/24 HR) 0.1 mg/hr patch      pantoprazole (PROTONIX) 40 MG tablet      simvastatin (ZOCOR) 40 MG tablet          DISCHARGE INSTRUCTIONS:   Follow-up with hospice home same-day  If you experience worsening of your admission symptoms, develop shortness of breath, life threatening emergency, suicidal or homicidal thoughts you must seek medical attention immediately by calling 911 or calling your MD immediately  if symptoms less  severe.  You Must read complete instructions/literature along with all the possible adverse reactions/side effects for all the Medicines you take and that have been prescribed to you. Take any new Medicines after you have completely understood and accept all the possible adverse reactions/side effects.   Please note  You were cared for by a hospitalist during your hospital stay. If you have any questions about your discharge medications or the care you received while you were in the hospital after you are discharged, you can call the unit and asked to speak with the hospitalist on call if the hospitalist that took care of you is not available. Once you are discharged, your primary care physician will handle any further medical issues. Please note that NO REFILLS for any discharge medications will be authorized once you are discharged, as it is imperative that you return to your primary care physician (or establish a relationship with a primary care physician if you do not have one) for your aftercare needs so that they can reassess your need for medications and monitor your lab values.    Today   CHIEF COMPLAINT:   Chief Complaint  Patient presents with  . Urinary Retention    HISTORY OF PRESENT ILLNESS:  Alexander Green  is a 82 y.o. male with a known history of Prostate cancer presented with urinary retention   VITAL SIGNS:  Blood pressure 113/69, pulse 154, temperature 99.4 F (37.4 C), temperature source Axillary, resp. rate 16, height 6' 5"  (1.956 m), weight 77.565 kg (171 lb), SpO2 85 %.   DATA REVIEW:   CBC  Recent Labs Lab 03/24/16 0444  WBC 4.8  HGB 12.1*  HCT 33.9*  PLT 137*    Chemistries   Recent Labs Lab 03/24/16 0444  NA 133*  K 3.0*  CL 102  CO2 19*  GLUCOSE 118*  BUN 15  CREATININE 0.58*  CALCIUM 8.6*    Cardiac Enzymes No results for input(s): TROPONINI in the last 168 hours.  Microbiology Results  Results for orders placed or performed during  the hospital encounter of 03/18/16  Urine culture     Status: Abnormal   Collection Time: 03/23/16  7:39 PM  Result Value Ref Range Status   Specimen Description URINE, RANDOM  Final   Special Requests NONE  Final   Culture (A)  Final    >=100,000 COLONIES/mL STAPHYLOCOCCUS SPECIES (COAGULASE NEGATIVE)   Report Status 03/26/2016 FINAL  Final   Organism ID, Bacteria STAPHYLOCOCCUS SPECIES (COAGULASE NEGATIVE) (A)  Final      Susceptibility   Staphylococcus species (coagulase negative) - MIC*    CIPROFLOXACIN <=0.5 SENSITIVE Sensitive     GENTAMICIN <=0.5 SENSITIVE Sensitive     NITROFURANTOIN <=16 SENSITIVE Sensitive     OXACILLIN Value in next row Resistant      >=4 RESISTANTWARNING: For oxacillin-resistant S.aureus and coagulase-negative staphylococci (MRS), other beta-lactam agents, ie, penicillins, beta-lactam/beta-lactamase inhibitor combinations,  cephems (with the exception of the cephalosporins with anti-MRSA activity), and carbapenems, may appear active in vitro, but are not effective clinically.  --CLSI, Vol.32 No.3, January 2012, pg 70.    TETRACYCLINE Value in next row Sensitive      >=4 RESISTANTWARNING: For oxacillin-resistant S.aureus and coagulase-negative staphylococci (MRS), other beta-lactam agents, ie, penicillins, beta-lactam/beta-lactamase inhibitor combinations, cephems (with the exception of the cephalosporins with anti-MRSA activity), and carbapenems, may appear active in vitro, but are not effective clinically.  --CLSI, Vol.32 No.3, January 2012, pg 70.    VANCOMYCIN Value in next row Sensitive      >=4 RESISTANTWARNING: For oxacillin-resistant S.aureus and coagulase-negative staphylococci (MRS), other beta-lactam agents, ie, penicillins, beta-lactam/beta-lactamase inhibitor combinations, cephems (with the exception of the cephalosporins with anti-MRSA activity), and carbapenems, may appear active in vitro, but are not effective clinically.  --CLSI, Vol.32 No.3, January  2012, pg 70.    TRIMETH/SULFA Value in next row Sensitive      >=4 RESISTANTWARNING: For oxacillin-resistant S.aureus and coagulase-negative staphylococci (MRS), other beta-lactam agents, ie, penicillins, beta-lactam/beta-lactamase inhibitor combinations, cephems (with the exception of the cephalosporins with anti-MRSA activity), and carbapenems, may appear active in vitro, but are not effective clinically.  --CLSI, Vol.32 No.3, January 2012, pg 70.    CLINDAMYCIN Value in next row Sensitive      >=4 RESISTANTWARNING: For oxacillin-resistant S.aureus and coagulase-negative staphylococci (MRS), other beta-lactam agents, ie, penicillins, beta-lactam/beta-lactamase inhibitor combinations, cephems (with the exception of the cephalosporins with anti-MRSA activity), and carbapenems, may appear active in vitro, but are not effective clinically.  --CLSI, Vol.32 No.3, January 2012, pg 70.    RIFAMPIN Value in next row Sensitive      >=4 RESISTANTWARNING: For oxacillin-resistant S.aureus and coagulase-negative staphylococci (MRS), other beta-lactam agents, ie, penicillins, beta-lactam/beta-lactamase inhibitor combinations, cephems (with the exception of the cephalosporins with anti-MRSA activity), and carbapenems, may appear active in vitro, but are not effective clinically.  --CLSI, Vol.32 No.3, January 2012, pg 70.    * >=100,000 COLONIES/mL STAPHYLOCOCCUS SPECIES (COAGULASE NEGATIVE)    Management plans discussed with the patient, family and they are in agreement.  CODE STATUS:     Code Status Orders        Start     Ordered   03/24/16 1520  Do not attempt resuscitation (DNR)   Continuous    Question Answer Comment  In the event of cardiac or respiratory ARREST Do not call a "code blue"   In the event of cardiac or respiratory ARREST Do not perform Intubation, CPR, defibrillation or ACLS   In the event of cardiac or respiratory ARREST Use medication by any route, position, wound care, and other  measures to relive pain and suffering. May use oxygen, suction and manual treatment of airway obstruction as needed for comfort.      03/24/16 1520    Code Status History    Date Active Date Inactive Code Status Order ID Comments User Context   03/24/2016  3:18 PM 03/24/2016  3:21 PM DNR [id number]  Lytle Butte, MD Inpatient   03/19/2016  2:56 AM 03/24/2016  3:18 PM Full Code [id number]  Lance Coon, MD Inpatient      TOTAL TIME TAKING CARE OF THIS PATIENT: 35 minutes.    Loletha Grayer M.D on 03/27/2016 at 12:28 PM  Between 7am to 6pm - Pager - [phone]  After 6pm go to [email or web address] - password [email or web address] [facility]  [phone]  CC: Primary care physician; Otilio Miu, MD

## 2016-03-27 NOTE — Progress Notes (Signed)
Pt transported to Hospice via EMS. Hospice RN called report to Hospice, notified family, and called for EMS transport. Paperwork given to EMS. V/S unstable yet patient comfortable and resting. IV left in place for continuation of morphine drip at Hospice. Family updated and aware of plan.

## 2016-03-27 NOTE — Progress Notes (Signed)
PT Cancellation Note  Patient Details Name: Alexander Green MRN: [id number] DOB: May 13, 1929   Cancelled Treatment:    Reason Eval/Treat Not Completed: Other (comment). Pt now on comfort care. Will dc current orders.    Merideth Bosque 03/27/2016, 8:55 AM Greggory Stallion, PT, DPT [phone]

## 2016-03-27 NOTE — Care Management (Signed)
Patient to transfer to [facility].  RNCM signing off.

## 2016-03-27 NOTE — Progress Notes (Signed)
Patient ID: Alexander Green, male   DOB: 02-Jul-1929, 82 y.o.   MRN: [id number]  Case discussed with hospice nurse and they have a bed at the hospice home and they will transport him over there today.  Total 35 minutes spent on patient care today in coordinating discharge.  Dr. Loletha Grayer

## 2016-03-27 NOTE — Clinical Social Work Note (Signed)
CSW informed that patient is hospice home appropriate and that family had already contacted Santiago Glad at [facility] requesting for patient to be transferred to hospice home. Santiago Glad spoke with patient's family and has made arrangements for patient to transfer to hospice home today.  Shela Leff MSW,LCSW [phone]

## 2016-03-27 NOTE — Care Management Important Message (Signed)
Important Message  Patient Details  Name: Arnett Habibi MRN: [id number] Date of Birth: May 25, 1929   Medicare Important Message Given:  Yes    Beverly Sessions, RN 03/27/2016, 11:34 AM

## 2016-04-05 ENCOUNTER — Telehealth: Payer: Self-pay | Admitting: Internal Medicine

## 2016-04-05 NOTE — Telephone Encounter (Signed)
[name] wife called to inform you that Alexander Green passed away on 04-07-2016. Thank you.

## 2016-04-15 DEATH — deceased

## 2016-06-06 ENCOUNTER — Ambulatory Visit: Payer: Medicare Other | Admitting: Internal Medicine

## 2016-06-06 ENCOUNTER — Ambulatory Visit: Payer: Medicare Other

## 2016-06-06 ENCOUNTER — Other Ambulatory Visit: Payer: Medicare Other

## 2017-03-21 IMAGING — CR DG CHEST 2V
1 series · 2 of 2 positions shown · non-contrast
Comparison: Chest abdomen and pelvis CT 02/03/2016 and earlier

CLINICAL DATA: 87-year-old male with difficulty swallowing, feels
like pills he took last night are stuck in his throat and chest.
Epigastric pain and burning. Initial encounter.

EXAM:
CHEST  2 VIEW

[Series 1: dg chest 2 view · 0.14mm/px · 2 of 2 slices shown]
[im 1/2]
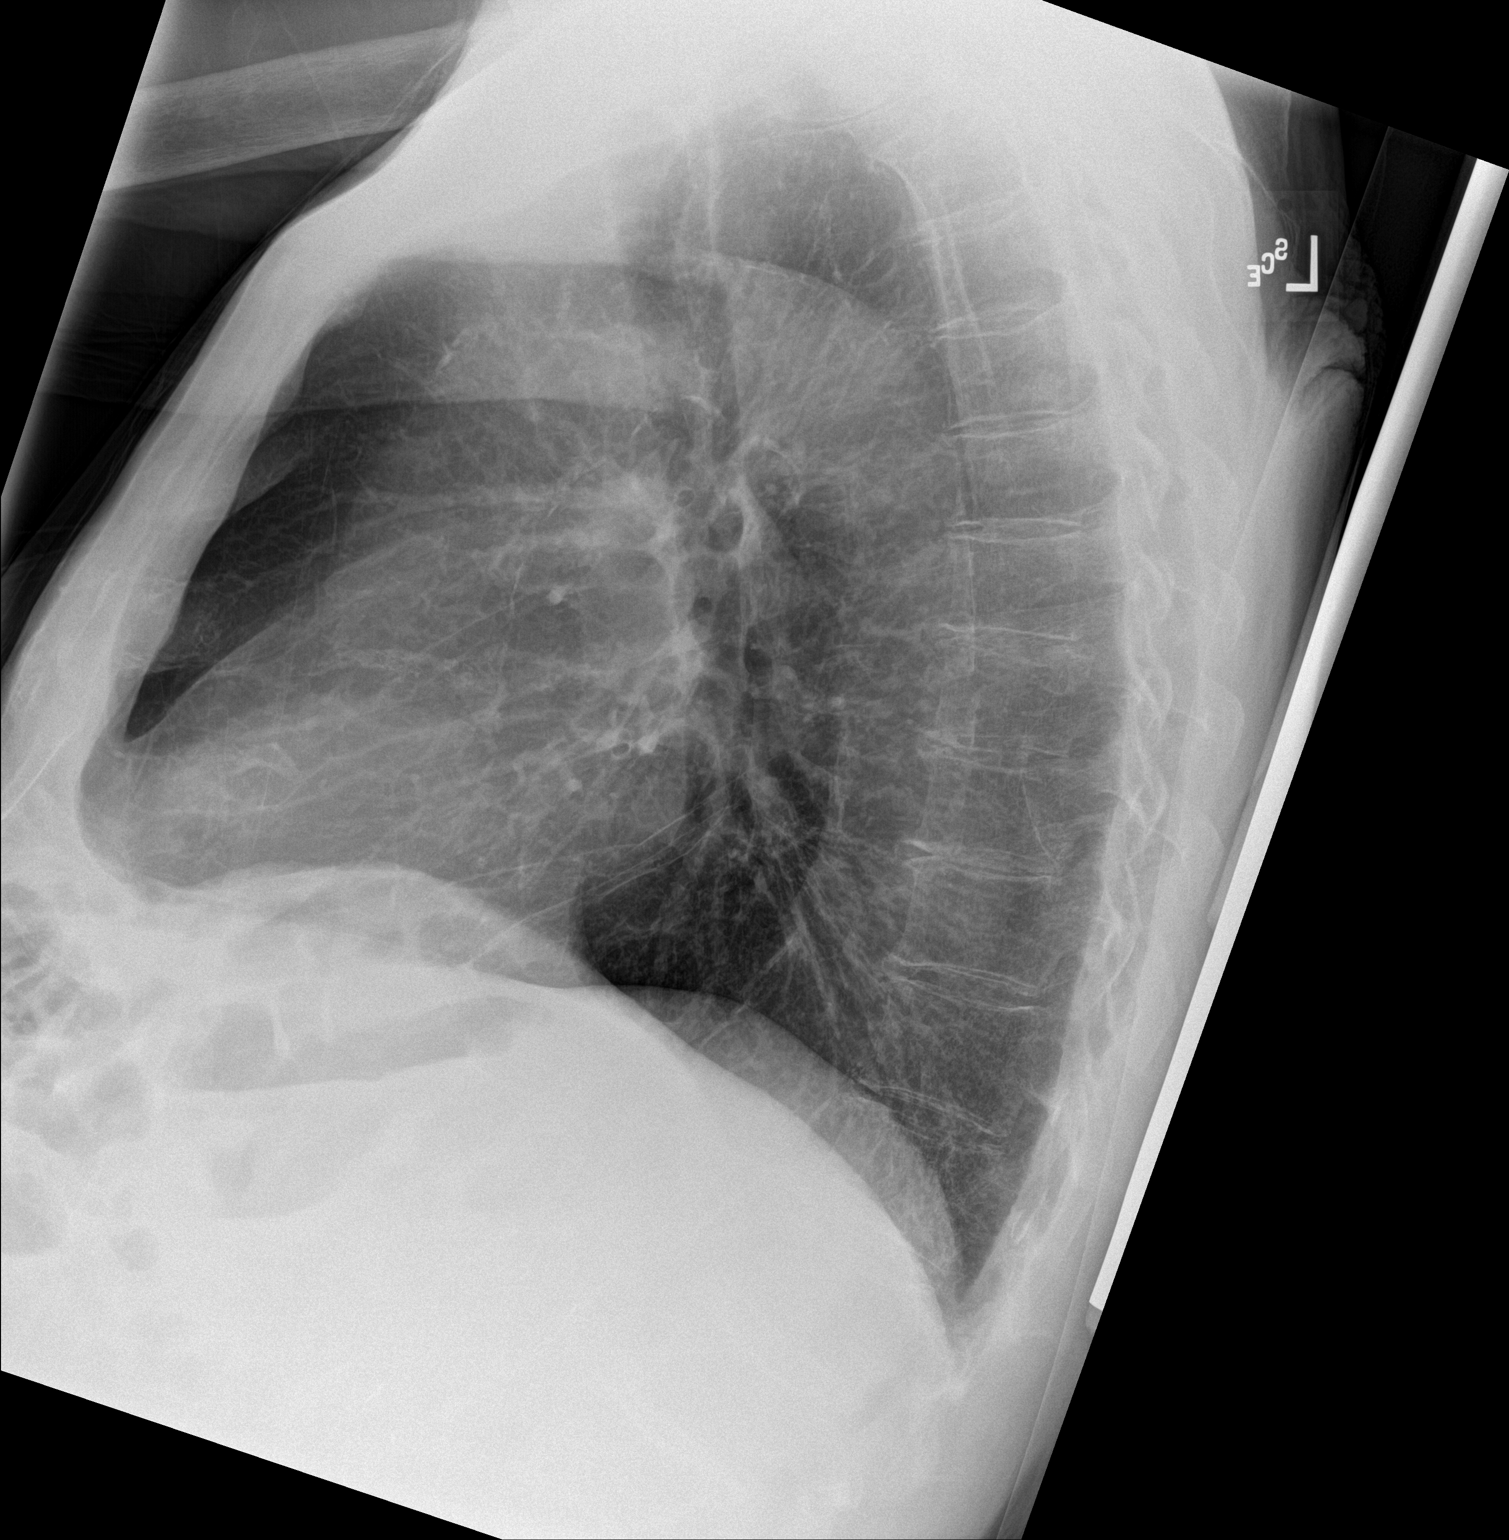
[im 2/2]
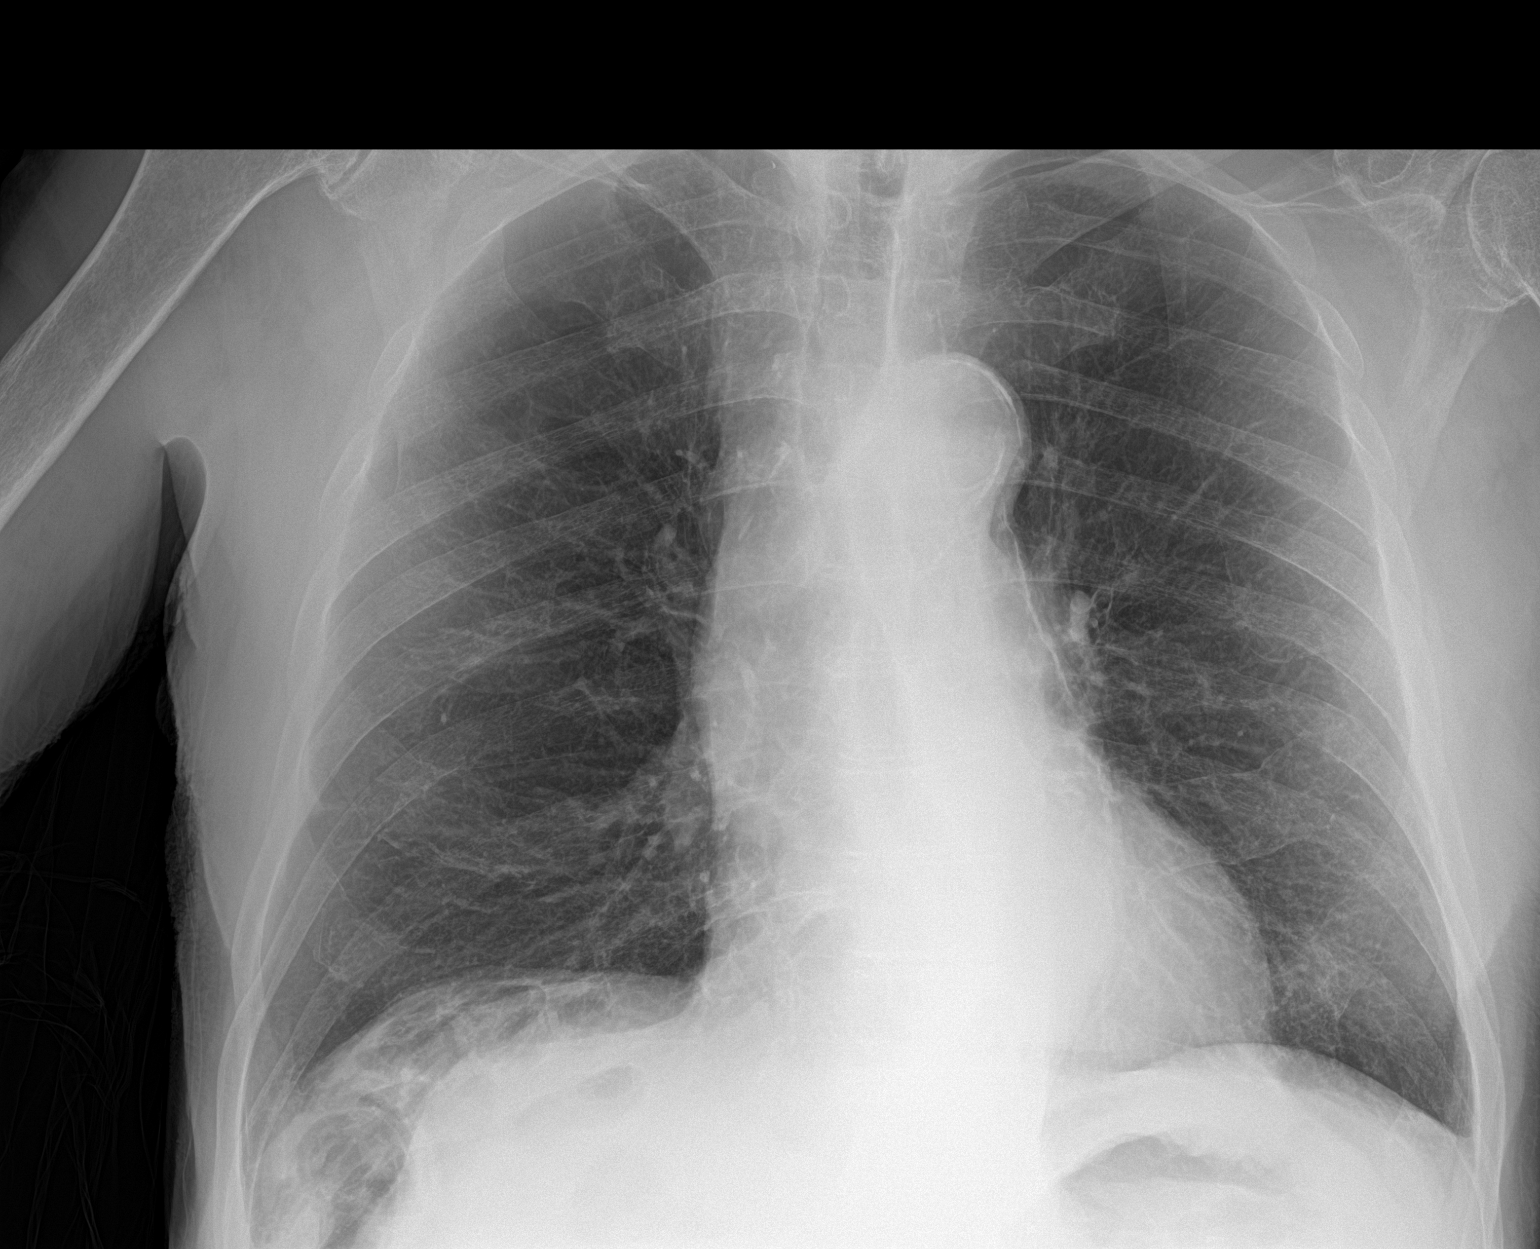

[2 of 2 positions shown; findings below may reference images not displayed]

FINDINGS: Seated AP and lateral views of the chest. Calcified aortic
atherosclerosis. Mediastinal contours are stable since 7522 and
within normal limits. Visualized tracheal air column is within
normal limits. No gas is evident within the esophagus. No esophageal
dilatation is evident. No radiopaque foreign body identified. Stable
lung volumes. No pneumothorax, pulmonary edema, pleural effusion or
confluent pulmonary opacity. Negative visible bowel gas pattern, no
pneumoperitoneum identified. Osteopenia. No acute osseous
abnormality identified.
IMPRESSION: No acute cardiopulmonary abnormality.

## 2017-03-22 IMAGING — CR DG CHEST 2V
1 series · 2 of 2 positions shown · non-contrast
Comparison: 03/17/2016

CLINICAL DATA: Hypoxia.

EXAM:
CHEST  2 VIEW

[Series 1: dg chest 2 view · 0.14mm/px · 2 of 2 slices shown]
[im 1/2]
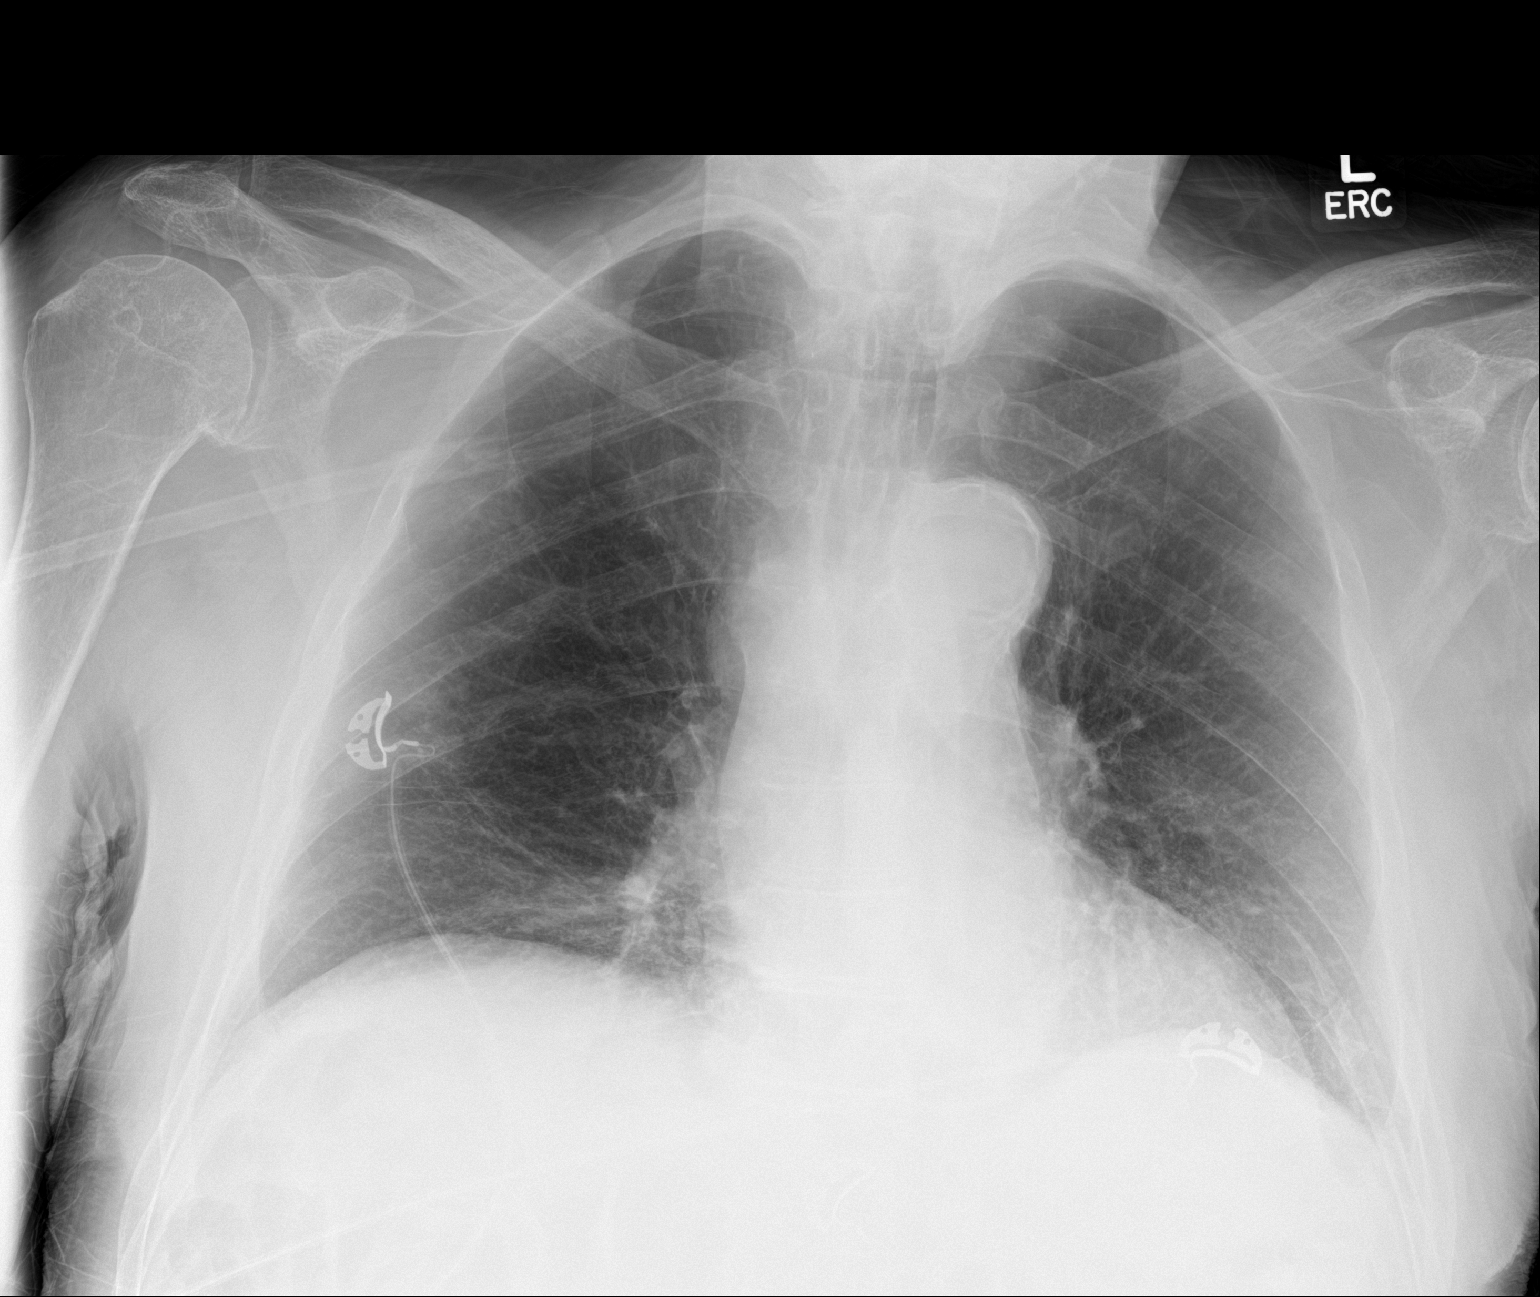
[im 2/2]
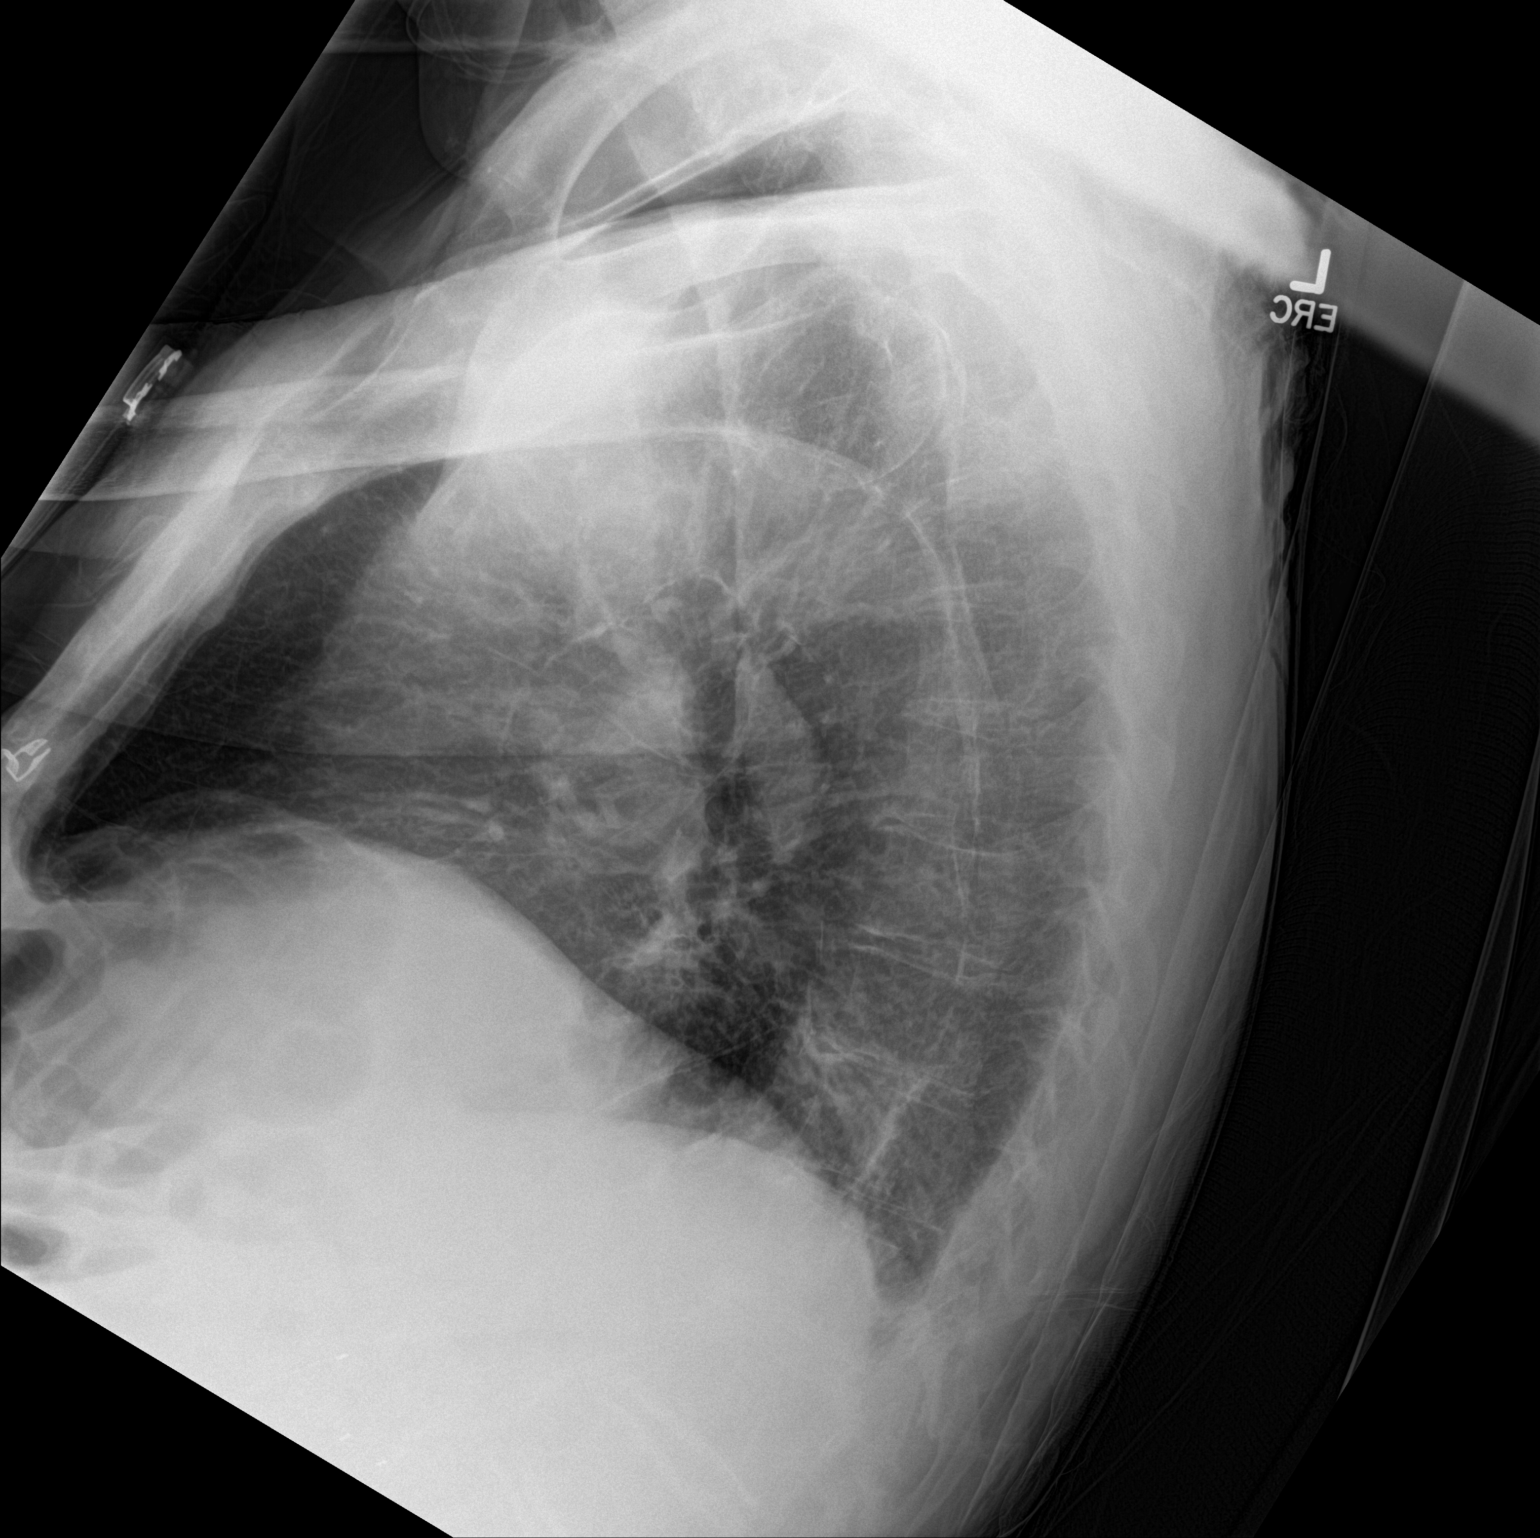

[2 of 2 positions shown; findings below may reference images not displayed]

FINDINGS: Emphysema with lower volumes and mild basilar atelectasis. There is
no edema, consolidation, effusion, or pneumothorax. Normal heart
size for technique. Known mediastinal adenopathy with visible
enlarged right lower peritracheal node.
IMPRESSION: 1. Lower volumes with mild basilar atelectasis.
2. Emphysema.
3. Known mediastinal adenopathy.

## 2017-03-26 IMAGING — RF DG ESOPHAGUS
1 series · 3 of 3 positions shown · non-contrast
Comparison: None.

CLINICAL DATA: Dysphagia, difficulty swallowing

EXAM:
ESOPHOGRAM/BARIUM SWALLOW
TECHNIQUE: Single contrast examination was performed using  thin barium.
FLUOROSCOPY TIME:  Radiation Exposure Index (as provided by the
fluoroscopic device): 0.8 mGy

[Series 1: cp_standard · 0.51mm/px · 3 of 28 frames shown]
[frame 5/28]
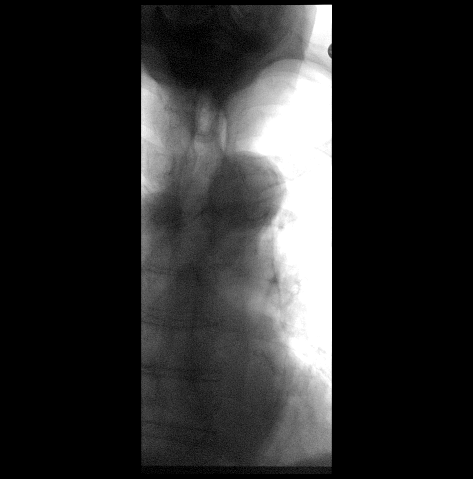
[frame 15/28]
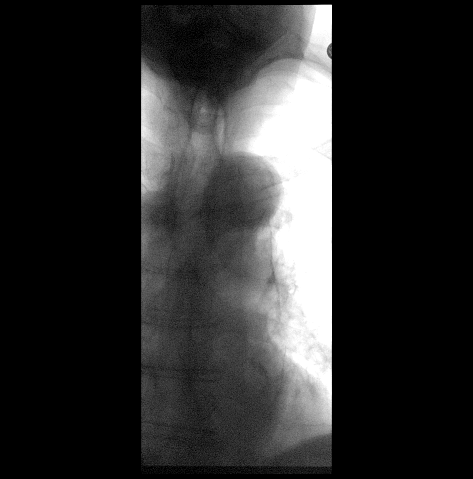
[frame 24/28]
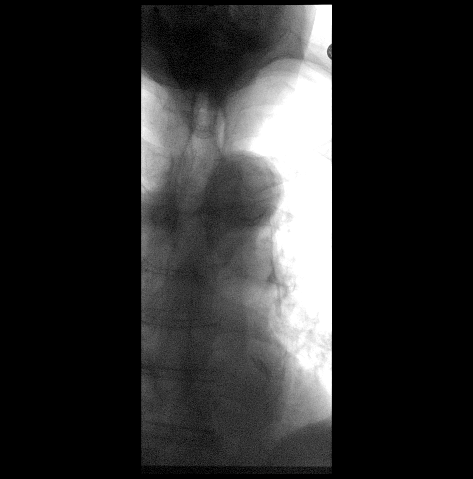

[3 of 3 positions shown; findings below may reference images not displayed]

FINDINGS: The patient was placed in a semi upright position. The patient was
asked to drink thin barium. The patient is unable to drink thin
barium follow multiple attempts. There is overall difficulty
initiating an oral phase.
IMPRESSION: Aborted esophagram. Recommend evaluation with cine with speech
pathology.
